# Patient Record
Sex: Female | Born: 1985
Health system: Southern US, Community
[De-identification: ages and names within clinical notes are randomized; demographics above are authoritative.]

## PROBLEM LIST (undated history)

## (undated) ENCOUNTER — Inpatient Hospital Stay (HOSPITAL_COMMUNITY): Payer: Self-pay

## (undated) DIAGNOSIS — K219 Gastro-esophageal reflux disease without esophagitis: Secondary | ICD-10-CM

## (undated) DIAGNOSIS — E039 Hypothyroidism, unspecified: Secondary | ICD-10-CM

## (undated) DIAGNOSIS — T7840XA Allergy, unspecified, initial encounter: Secondary | ICD-10-CM

## (undated) DIAGNOSIS — O24419 Gestational diabetes mellitus in pregnancy, unspecified control: Secondary | ICD-10-CM

## (undated) DIAGNOSIS — G43909 Migraine, unspecified, not intractable, without status migrainosus: Secondary | ICD-10-CM

## (undated) DIAGNOSIS — Z8619 Personal history of other infectious and parasitic diseases: Secondary | ICD-10-CM

## (undated) DIAGNOSIS — E063 Autoimmune thyroiditis: Secondary | ICD-10-CM

## (undated) DIAGNOSIS — Z8719 Personal history of other diseases of the digestive system: Secondary | ICD-10-CM

## (undated) DIAGNOSIS — N904 Leukoplakia of vulva: Secondary | ICD-10-CM

## (undated) HISTORY — PX: DENTAL SURGERY: SHX609

## (undated) HISTORY — DX: Personal history of other infectious and parasitic diseases: Z86.19

## (undated) HISTORY — DX: Personal history of other diseases of the digestive system: Z87.19

## (undated) HISTORY — DX: Leukoplakia of vulva: N90.4

## (undated) HISTORY — DX: Migraine, unspecified, not intractable, without status migrainosus: G43.909

## (undated) HISTORY — DX: Gastro-esophageal reflux disease without esophagitis: K21.9

## (undated) HISTORY — DX: Allergy, unspecified, initial encounter: T78.40XA

---

## 2009-03-06 ENCOUNTER — Encounter: Payer: Self-pay | Admitting: *Deleted

## 2009-03-23 ENCOUNTER — Encounter: Payer: Self-pay | Admitting: Family Medicine

## 2009-03-23 ENCOUNTER — Ambulatory Visit: Payer: Self-pay | Admitting: Family Medicine

## 2009-03-23 DIAGNOSIS — E063 Autoimmune thyroiditis: Secondary | ICD-10-CM | POA: Insufficient documentation

## 2009-03-23 DIAGNOSIS — E0590 Thyrotoxicosis, unspecified without thyrotoxic crisis or storm: Secondary | ICD-10-CM | POA: Insufficient documentation

## 2009-04-12 ENCOUNTER — Encounter: Payer: Self-pay | Admitting: Family Medicine

## 2009-05-30 ENCOUNTER — Encounter: Payer: Self-pay | Admitting: Family Medicine

## 2009-05-30 ENCOUNTER — Ambulatory Visit: Payer: Self-pay | Admitting: Family Medicine

## 2009-05-30 DIAGNOSIS — M779 Enthesopathy, unspecified: Secondary | ICD-10-CM | POA: Insufficient documentation

## 2009-05-30 LAB — CONVERTED CEMR LAB
Creatinine, Ser: 0.83 mg/dL (ref 0.40–1.20)
Sodium: 141 meq/L (ref 135–145)

## 2009-05-31 ENCOUNTER — Encounter: Payer: Self-pay | Admitting: Family Medicine

## 2009-09-21 ENCOUNTER — Ambulatory Visit: Payer: Self-pay | Admitting: Family Medicine

## 2009-09-21 DIAGNOSIS — R220 Localized swelling, mass and lump, head: Secondary | ICD-10-CM | POA: Insufficient documentation

## 2009-09-21 DIAGNOSIS — R221 Localized swelling, mass and lump, neck: Secondary | ICD-10-CM

## 2009-10-11 ENCOUNTER — Encounter: Payer: Self-pay | Admitting: Family Medicine

## 2009-10-25 ENCOUNTER — Encounter: Payer: Self-pay | Admitting: Family Medicine

## 2010-05-08 ENCOUNTER — Encounter: Payer: Self-pay | Admitting: Family Medicine

## 2010-09-03 NOTE — Consult Note (Signed)
Summary: [facility]  [facility]   Imported By: Knox Royalty 11/12/2009 14:19:52  _____________________________________________________________________  External Attachment:    Type:   Image     Comment:   External Document

## 2010-09-03 NOTE — Assessment & Plan Note (Signed)
Summary: sore throat,df   Vital Signs:  Patient profile:   25 year old female Height:      61.5 inches Weight:      115 pounds BMI:     21.45 BSA:     1.50 Temp:     98.9 degrees F Pulse rate:   109 / minute BP sitting:   120 / 82  Vitals Entered By: Jone Baseman CMA (September 21, 2009 2:24 PM) CC: sore tongue x 2 days Is Patient Diabetic? No Pain Assessment Patient in pain? yes     Location: under tongue Intensity: 3   CC:  sore tongue x 2 days.  History of Present Illness: 1. sore tongue Has had a sore tongue for 2 days. Initially started out as a mild sore on the underside of right side of her tongue. Slowly progressive until today when she noticed more pain and swelling involving both sides of her tongue. Has seen white spots in the area as well. No drainage or pus.   PMHx: no history of oral herpes or oral ulcers. Does have an unusual medical condition wherein she still has many of her primary teeth. 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 Last surgery was in 2009. No recent medication changes. otherwise healthy.   Habits & Providers  Alcohol-Tobacco-Diet     Tobacco Status: never  Problems Prior to Update: 1)  Tendinitis  (ICD-726.90) 2)  Hyperthyroidism, Subclinical  (ICD-242.90) 3)  Family History Diabetes 1st Degree Relative  (ICD-V18.0)  Current Medications (verified): 1)  Ortho Tri-Cyclen (28) 0.18/0.215/0.25 Mg-35 Mcg Tabs (Norgestim-Eth Estrad Triphasic) .... One By Mouth Per Day  Allergies (verified): 1)  ! Amoxicillin 2)  ! * Cefzil  Review of Systems       no fevers, chills, nausea/vomiting; no chest pain, SOB.   Physical Exam  General:  Well-developed,well-nourished,in no acute distress; alert,appropriate and cooperative throughout examination. VS reviewed and normal  Mouth:  MMM; mucosa is pink generally; dentition is appropriate, however many teeth are primary  with some yellow discoloration; hygeine is good. On inferior surface of tonger there is erythema and swelling with some white exudate worse on the R side of the frenulum. This area is TTP. No discharge, drainage, pus or apthous ulcers.  Neck:  mild R sided anterior cevical LAD. Mildly TTP Lungs:  work of breathing unlabored, clear to auscultation bilaterally; no wheezes, rales, or ronchi; good air movement throughout  Heart:  regular rate and rhythm, no murmurs; normal s1/s2    Impression & Recommendations:  Problem # 1:  SWELLING MASS OR LUMP IN HEAD AND NECK (ICD-784.2) Assessment New  no systemic signs of infection. would be atypical location and presentation for an abscess. Will hold off on antibiotics. Does not appear to be ludwigs angina. Dought HSV or herpangina given presentation. conservative measures for now with saltwater, topical anesthetics and ibuprofen. To return on not seeing some improvement by monday. Red flags reviewed with pt and she expresses understanding. Doub this is related to previous surgeries given that they are so remote.   Orders: [facility]- Est Level  3 ([address])  Complete Medication List: 1)  Ortho Tri-cyclen (28) 0.18/0.215/0.25 Mg-35 Mcg Tabs (Norgestim-eth estrad triphasic) .... One by mouth per day  Patient Instructions: 1)  gargle with saltwater 4-5 times a day. 2)  use ibuprofen 600 mg every 6-8 hours to help with pain 3)  use Anbesol or oragel  to help with pain.  4)  follow-up if not getting better by early next week. Proceed to the emergency department over the weekend for worsening swelling, problems breathing, or other concerns.  5)  drink lots of fluid

## 2010-09-03 NOTE — Consult Note (Signed)
Summary: [facility]   Imported By: Abundio Miu 05/23/2010 08:30:07  _____________________________________________________________________  External Attachment:    Type:   Image     Comment:   External Document

## 2010-09-18 ENCOUNTER — Encounter: Payer: Self-pay | Admitting: *Deleted

## 2011-06-05 ENCOUNTER — Ambulatory Visit (INDEPENDENT_AMBULATORY_CARE_PROVIDER_SITE_OTHER): Payer: BC Managed Care – PPO | Admitting: *Deleted

## 2011-06-05 VITALS — Temp 98.5°F

## 2011-06-05 DIAGNOSIS — Z23 Encounter for immunization: Secondary | ICD-10-CM

## 2011-07-25 ENCOUNTER — Encounter: Payer: Self-pay | Admitting: Family Medicine

## 2011-07-25 ENCOUNTER — Ambulatory Visit (INDEPENDENT_AMBULATORY_CARE_PROVIDER_SITE_OTHER): Payer: BC Managed Care – PPO | Admitting: Family Medicine

## 2011-07-25 DIAGNOSIS — J069 Acute upper respiratory infection, unspecified: Secondary | ICD-10-CM

## 2011-07-25 NOTE — Assessment & Plan Note (Signed)
Likely post viral cough and nasal congestion. Discussed with pt that sxs may take 2-4 weeks to completely resolve. Also discussed with pt that prolonged pseudophed use may exacerbate nasal congestion. Discussed supportive care. Handout given.

## 2011-07-25 NOTE — Progress Notes (Signed)
  Subjective:    Patient ID: Sherry Shepherd, female    DOB: 1986/02/11, 25 y.o.   MRN: [id number]  HPI Pt presents today with URI sxs x 2 weeks. Pt states that her sxs initially began as viral URI sxs including nasla congestion, rhinorrhea, headache, generalized malaise, cough, intermittent fevers. Pt states that fevers and malaise stopped over the course of a week. Sxs that remained were nasal congestion, rhinorrhea, and cough. Nasal congestion and cough seem to be worse in am. No fevers or increased WOB. Pt is a non smoker. Pt states that she has been using mucinex and pseudophed since onset of sxs with minimal improvement in sxs. No hx/o allergies or sinus disease.    Review of Systems See HPI, otherwise ROS negative     Objective:   Physical Exam Gen: up in chair, NAD HEENT: NCAT, EOMI, TMs clear bilaterally, +nasal erythema, rhinorrhea bilaterally, + post oropharyngeal erythema  CV: RRR, no murmurs auscultated PULM: CTAB, no wheezes, rales, rhoncii ABD: S/NT/+ bowel sounds  EXT: 2+ peripheral pulses   Assessment & Plan:

## 2011-07-25 NOTE — Patient Instructions (Signed)
Viral Infections A viral infection can be caused by different types of viruses.Most viral infections are not serious and resolve on their own. However, some infections may cause severe symptoms and may lead to further complications. SYMPTOMS Viruses can frequently cause:  Minor sore throat.   Aches and pains.   Headaches.   Runny nose.   Different types of rashes.   Watery eyes.   Tiredness.   Cough.   Loss of appetite.   Gastrointestinal infections, resulting in nausea, vomiting, and diarrhea.  These symptoms do not respond to antibiotics because the infection is not caused by bacteria. However, you might catch a bacterial infection following the viral infection. This is sometimes called a "superinfection." Symptoms of such a bacterial infection may include:  Worsening sore throat with pus and difficulty swallowing.   Swollen neck glands.   Chills and a high or persistent fever.   Severe headache.   Tenderness over the sinuses.   Persistent overall ill feeling (malaise), muscle aches, and tiredness (fatigue).   Persistent cough.   Yellow, green, or brown mucus production with coughing.  HOME CARE INSTRUCTIONS   Only take over-the-counter or prescription medicines for pain, discomfort, diarrhea, or fever as directed by your caregiver.   Drink enough water and fluids to keep your urine clear or pale yellow. Sports drinks can provide valuable electrolytes, sugars, and hydration.   Get plenty of rest and maintain proper nutrition. Soups and broths with crackers or rice are fine.  SEEK IMMEDIATE MEDICAL CARE IF:   You have severe headaches, shortness of breath, chest pain, neck pain, or an unusual rash.   You have uncontrolled vomiting, diarrhea, or you are unable to keep down fluids.   You have a temperature above 102 F (38.9 C), not controlled by medicine.  MAKE SURE YOU:   Understand these instructions.   Will watch your condition.   Will get help right  away if you are not doing well or get worse.  Document Released: 04/30/2005 Document Revised: 04/02/2011 Document Reviewed: 11/25/2010 [facility] Patient Information 2012 [place], [place].

## 2011-08-08 ENCOUNTER — Encounter: Payer: Self-pay | Admitting: Family Medicine

## 2011-08-08 ENCOUNTER — Ambulatory Visit (INDEPENDENT_AMBULATORY_CARE_PROVIDER_SITE_OTHER): Payer: BC Managed Care – PPO | Admitting: Family Medicine

## 2011-08-08 VITALS — BP 127/85 | HR 121 | Temp 98.1°F | Ht 61.0 in | Wt 129.0 lb

## 2011-08-08 DIAGNOSIS — J069 Acute upper respiratory infection, unspecified: Secondary | ICD-10-CM

## 2011-08-08 MED ORDER — BENZONATATE 100 MG PO CAPS: 100.0000 mg | ORAL_CAPSULE | Freq: Two times a day (BID) | ORAL | Status: AC | PRN

## 2011-08-08 NOTE — Patient Instructions (Signed)
It was nice to meet you.  I do not see any signs of bacterial infection, so I do not think antibiotics will help you.  Your cough seems often enough that I want you to try a cough medication called Tessalon pearls so that your cough is suppressed.  You should also keep taking the mucinex.   If you start having fevers, chills, feels short of breath, please call the office to be seen.

## 2011-08-08 NOTE — Assessment & Plan Note (Signed)
No sign of bacterial infection PNA, Bronchitis, sinusitis, otitis on exam today.  Also patient reports no fevers and feeling well except cough.  I feel this is still a Viral URI, but the cough is very disruptive to her work.  Will Rx Tessalon pearls for anti-tussive medication she can take during work.  Advised continue mucinex.    Reviewed signs (fever, shortness of breath) of bacterial infection, patient instructed to return if those symptoms develop.

## 2011-08-08 NOTE — Progress Notes (Signed)
  Subjective:    Patient ID: Sherry Shepherd, female    DOB: 1986-07-22, 26 y.o.   MRN: [id number]  HPI  Sherry Shepherd comes back in due to continued cough after a viral illness.  She says she had a virus about 4 weeks and initially had a low grade fever, felt fatigued, but those symptoms cleared, and a cough and congestion has persisted.  She says she coughs constantly during the day, which makes people uncomfortable at work.  She also talks on the phone a lot at work, which is interrupted by the coughing.  She is taking the mucinex and zyrtec as advised by Dr. Alvester Morin, and has not taken Sudafed since seeing him.  She has also been drinking lots of water and hot tea.     Review of Systems  Constitutional: Negative for fever, chills and fatigue.  HENT: Negative for sneezing and neck pain.   Eyes: Negative for itching.  Respiratory: Positive for cough. Negative for shortness of breath.   Cardiovascular: Negative for chest pain.  Gastrointestinal: Negative for nausea, vomiting and diarrhea.  Neurological: Negative for dizziness and weakness.       Objective:   Physical Exam BP 127/85  Pulse 121  Temp(Src) 98.1 F (36.7 C) (Oral)  Ht 5\' 1"  (1.549 m)  Wt 129 lb (58.514 kg)  BMI 24.37 kg/m2  LMP 07/10/2011 General appearance: alert, cooperative and coughing frequently but otherwise well appearing.  Eyes: PERRL, EOMIT, conjunctiva clear Ears: normal TM's and external ear canals both ears Nose: Nares normal. Septum midline. Mucosa normal. No drainage or sinus tenderness. Throat: lips, mucosa, and tongue normal; teeth and gums normal and no tonsillar erythema or exudates Neck: no adenopathy Lungs: clear to auscultation bilaterally Heart: regular rate and rhythm, S1, S2 normal, no murmur, click, rub or gallop       Assessment & Plan:

## 2011-10-07 ENCOUNTER — Encounter: Payer: Self-pay | Admitting: Family Medicine

## 2011-10-07 ENCOUNTER — Ambulatory Visit (INDEPENDENT_AMBULATORY_CARE_PROVIDER_SITE_OTHER): Payer: BC Managed Care – PPO | Admitting: Family Medicine

## 2011-10-07 VITALS — BP 114/77 | HR 89 | Temp 97.9°F | Ht 61.0 in | Wt 133.0 lb

## 2011-10-07 DIAGNOSIS — Z7185 Encounter for immunization safety counseling: Secondary | ICD-10-CM | POA: Insufficient documentation

## 2011-10-07 DIAGNOSIS — E663 Overweight: Secondary | ICD-10-CM

## 2011-10-07 DIAGNOSIS — R5383 Other fatigue: Secondary | ICD-10-CM | POA: Insufficient documentation

## 2011-10-07 DIAGNOSIS — Z7189 Other specified counseling: Secondary | ICD-10-CM

## 2011-10-07 DIAGNOSIS — Z23 Encounter for immunization: Secondary | ICD-10-CM

## 2011-10-07 DIAGNOSIS — R5381 Other malaise: Secondary | ICD-10-CM

## 2011-10-07 DIAGNOSIS — Z831 Family history of other infectious and parasitic diseases: Secondary | ICD-10-CM

## 2011-10-07 DIAGNOSIS — E0590 Thyrotoxicosis, unspecified without thyrotoxic crisis or storm: Secondary | ICD-10-CM

## 2011-10-07 DIAGNOSIS — Z840 Family history of diseases of the skin and subcutaneous tissue: Secondary | ICD-10-CM

## 2011-10-07 HISTORY — DX: Overweight: E66.3

## 2011-10-07 LAB — COMPREHENSIVE METABOLIC PANEL
Albumin: 4.3 g/dL (ref 3.5–5.2)
Alkaline Phosphatase: 66 U/L (ref 39–117)
BUN: 9 mg/dL (ref 6–23)
CO2: 21 mEq/L (ref 19–32)
Calcium: 9.1 mg/dL (ref 8.4–10.5)
Glucose, Bld: 84 mg/dL (ref 70–99)
Potassium: 4 mEq/L (ref 3.5–5.3)

## 2011-10-07 LAB — CBC WITH DIFFERENTIAL/PLATELET
Hemoglobin: 12.8 g/dL (ref 12.0–15.0)
Lymphocytes Relative: 46 % (ref 12–46)
Lymphs Abs: 3.8 10*3/uL (ref 0.7–4.0)
MCH: 28.2 pg (ref 26.0–34.0)
Monocytes Relative: 7 % (ref 3–12)
Neutro Abs: 3.9 10*3/uL (ref 1.7–7.7)
Neutrophils Relative %: 46 % (ref 43–77)
Platelets: 317 10*3/uL (ref 150–400)
RBC: 4.54 MIL/uL (ref 3.87–5.11)
WBC: 8.4 10*3/uL (ref 4.0–10.5)

## 2011-10-07 LAB — LIPID PANEL
Cholesterol: 177 mg/dL (ref 0–200)
Total CHOL/HDL Ratio: 3 Ratio

## 2011-10-07 LAB — T4, FREE: Free T4: 1.2 ng/dL (ref 0.80–1.80)

## 2011-10-07 LAB — TSH: TSH: 2.583 u[IU]/mL (ref 0.350–4.500)

## 2011-10-07 MED ORDER — ALBENDAZOLE 200 MG PO TABS: 400.0000 mg | ORAL_TABLET | Freq: Once | ORAL | Status: AC

## 2011-10-07 NOTE — Progress Notes (Signed)
  Subjective:    Patient ID: Sherry Shepherd, female    DOB: [birth year], 26 y.o.   MRN: [id number]  HPI 26 year old female coming in with the potential for pinworms. 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 Patient though was told to be seen.  Patient also has had a history of subclinical hyperthyroidism. Recently though she has had some weight gain as well as increasing fatigue. Patient states that she has not been eating quite as she should and has even attempted to be eating less red meat recently. Patient continues take her birth control and takes a multivitamin as needed but not on regular basis. Patient denies any recent illnesses or any recent travel history. Patient states that she is sleeping well.  The patient in the past had been seen by a endocrinologist in [place] but has not followed up with him for 2 years. Patient is on ultrasounds which did show thyromegaly but no masses.  Patient also like to get a gardisil shot. Filed Weights   10/07/11 0931  Weight: 133 lb (60.328 kg)      Review of Systems Positive for fatigue but denies fever, chills, abdominal pain, diarrhea or constipation. Denies any rash.    Objective:   Physical Exam  Constitutional: She appears well-developed and well-nourished.  HENT:  Head: Normocephalic.  Right Ear: External ear normal.  Left Ear: External ear normal.  Eyes: Pupils are equal, round, and reactive to light.  Neck: Normal range of motion. No tracheal deviation present. Thyromegaly present.       Patient has mild thyromegaly with a left-sided cervical lymphadenopathy.  Cardiovascular: Normal rate, regular rhythm and normal heart sounds.   No murmur heard. Pulmonary/Chest: Effort normal and breath sounds normal.  Lymphadenopathy:    She has cervical adenopathy.  Neurological: She is alert.  Skin: Skin is warm  and dry.  Psychiatric: She has a normal mood and affect.      Assessment & Plan:

## 2011-10-07 NOTE — Assessment & Plan Note (Signed)
Very interesting history per patient. Patient does have a mild thyromegaly and very pronounced thyroid but did not feel that it is significantly enlarged. At this time we'll get a TSH and a free T4 and monitor. Do not think an ultrasound would help in this aspect but if anything changes or if labs are abnormal then we'll check.

## 2011-10-07 NOTE — Patient Instructions (Signed)
Very nice to meet you. I will give you a medicine called but then does all your take one pill once and then repeat in 2 weeks. I will check labs today to see if we can find a reason for your fatigue. I will call you with the results. He should probably followup with her primary care physician for a physical exam in the near future.

## 2011-10-07 NOTE — Progress Notes (Signed)
Addended by: Garen Grams F on: 10/07/2011 10:15 AM   Modules accepted: Orders

## 2011-10-07 NOTE — Assessment & Plan Note (Signed)
Patient's fatigue could be concerning for her thyroid also will check B12 levels and vitamin D Also check for any type of anemia. Discussed proper sleep habits with patient having irregular working schedule. Discussed the potential for melatonin if everything is normal.

## 2011-10-07 NOTE — Assessment & Plan Note (Signed)
Body mass index is 25.13 kg/(m^2). Which is just on the borderline of being overweight. We will check thyroid encouraged exercise and  nutrition.

## 2011-10-08 LAB — VITAMIN D 25 HYDROXY (VIT D DEFICIENCY, FRACTURES): Vit D, 25-Hydroxy: 57 ng/mL (ref 30–89)

## 2011-11-07 ENCOUNTER — Ambulatory Visit: Payer: BC Managed Care – PPO

## 2012-03-22 ENCOUNTER — Ambulatory Visit (INDEPENDENT_AMBULATORY_CARE_PROVIDER_SITE_OTHER): Payer: 59 | Admitting: Family Medicine

## 2012-03-22 ENCOUNTER — Encounter: Payer: Self-pay | Admitting: Family Medicine

## 2012-03-22 VITALS — BP 122/84 | HR 72 | Temp 98.2°F | Ht 61.0 in | Wt 130.9 lb

## 2012-03-22 DIAGNOSIS — L0100 Impetigo, unspecified: Secondary | ICD-10-CM

## 2012-03-22 MED ORDER — MUPIROCIN 2 % EX OINT: TOPICAL_OINTMENT | Freq: Three times a day (TID) | CUTANEOUS | Status: AC

## 2012-03-22 NOTE — Patient Instructions (Addendum)
Nice to meet you. You seem to have a limited infection to your finger now. Will treat with topical antibiotic. Make sure to continue hand washing, you can where a glove at work if desired. If not improved in a few days or worsened, then come back you may need a pill antibiotic.  Impetigo Impetigo is an infection of the skin, most common in babies and children.  CAUSES  It is caused by staphylococcal or streptococcal germs (bacteria). Impetigo can start after any damage to the skin. The damage to the skin may be from things like:   Chickenpox.   Scrapes.   Scratches.   Insect bites (common when children scratch the bite).   Cuts.   Nail biting or chewing.  Impetigo is contagious. It can be spread from one person to another. Avoid close skin contact, or sharing towels or clothing. SYMPTOMS  Impetigo usually starts out as small blisters or pustules. Then they turn into tiny yellow-crusted sores (lesions).  There may also be:  Large blisters.   Itching or pain.   Pus.   Swollen lymph glands.  With scratching, irritation, or non-treatment, these small areas may get larger. Scratching can cause the germs to get under the fingernails; then scratching another part of the skin can cause the infection to be spread there. DIAGNOSIS  Diagnosis of impetigo is usually made by a physical exam. A skin culture (test to grow bacteria) may be done to prove the diagnosis or to help decide the best treatment.  TREATMENT  Mild impetigo can be treated with prescription antibiotic cream. Oral antibiotic medicine may be used in more severe cases. Medicines for itching may be used. HOME CARE INSTRUCTIONS   To avoid spreading impetigo to other body areas:   Keep fingernails short and clean.   Avoid scratching.   Cover infected areas if necessary to keep from scratching.   Gently wash the infected areas with antibiotic soap and water.   Soak crusted areas in warm soapy water using antibiotic  soap.   Gently rub the areas to remove crusts. Do not scrub.   Wash hands often to avoid spread this infection.   Keep children with impetigo home from school or daycare until they have used an antibiotic cream for 48 hours (2 days) or oral antibiotic medicine for 24 hours (1 day), and their skin shows significant improvement.   Children may attend school or daycare if they only have a few sores and if the sores can be covered by a bandage or clothing.  SEEK MEDICAL CARE IF:   More blisters or sores show up despite treatment.   Other family members get sores.   Rash is not improving after 48 hours (2 days) of treatment.  SEEK IMMEDIATE MEDICAL CARE IF:   You see spreading redness or swelling of the skin around the sores.   You see red streaks coming from the sores.   Your child develops a fever of 100.4 F (37.2 C) or higher.   Your child develops a sore throat.   Your child is acting ill (lethargic, sick to their stomach).  Document Released: 07/18/2000 Document Revised: 07/10/2011 Document Reviewed: 05/17/2008 [facility] Patient Information 2012 [place], [place].

## 2012-03-22 NOTE — Progress Notes (Signed)
  Subjective:    Patient ID: Sherry Shepherd, female    DOB: 24-Oct-1985, 26 y.o.   MRN: [id number]  HPI  1. Left middle finger wound. Patient states last week she noticed a small vesicle that was very itchy on side of her left middle finger. She may have pinched herself here last week. She broke open the vesicle. Fluid has drained. Since that time it has continued to be irritated and draining yellowish and crusty fluid. She has used bandages over the period no new vesicles or spread. No fevers, malaise, joint pain, lesions elsewhere.   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 Denies any history of boils, MRSA infection.  Review of Systems See HPI otherwise negative.  reports that she has never smoked. She does not have any smokeless tobacco history on file.    Objective:   Physical Exam  Vitals reviewed. Constitutional: She is oriented to person, place, and time. She appears well-developed and well-nourished. No distress.  HENT:  Head: Normocephalic and atraumatic.  Eyes: EOM are normal.  Pulmonary/Chest: Effort normal.  Neurological: She is alert and oriented to person, place, and time.  Skin: No rash noted.       2-3 mm erythematous circumscribed area on medial aspect lateral left 3rd digit. No surrounding erythema, ROM intact, no edema. No underlying abscess.  Psychiatric: She has a normal mood and affect.       Assessment & Plan:

## 2012-03-22 NOTE — Assessment & Plan Note (Signed)
Perhaps very mild and isolated area of superficial skin irritation/impetigo. Will treat with topical mupirocin for 5-7 days. Discussed bandage changes BID. 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 Frequent handwashing and hygiene measures. Discussed followup if not improved or if lesion worsens.

## 2012-09-10 ENCOUNTER — Encounter: Payer: Self-pay | Admitting: Family Medicine

## 2012-09-10 ENCOUNTER — Ambulatory Visit (INDEPENDENT_AMBULATORY_CARE_PROVIDER_SITE_OTHER): Payer: 59 | Admitting: Family Medicine

## 2012-09-10 VITALS — BP 126/86 | HR 78 | Wt 135.0 lb

## 2012-09-10 DIAGNOSIS — K1370 Unspecified lesions of oral mucosa: Secondary | ICD-10-CM

## 2012-09-10 DIAGNOSIS — K1379 Other lesions of oral mucosa: Secondary | ICD-10-CM | POA: Insufficient documentation

## 2012-09-10 MED ORDER — NYSTATIN 100000 UNIT/ML MT SUSP: 500000.0000 [IU] | Freq: Four times a day (QID) | OROMUCOSAL | Status: DC

## 2012-09-10 NOTE — Progress Notes (Signed)
  Subjective:    Patient ID: Sherry Shepherd, female    DOB: 05/20/1986, 27 y.o.   MRN: [id number]  HPI  1.  Tongue pain:  Patient has "bumps under tongue" and tongue pain/swelling at the tip and lower lip for past 3 days or so.  No changes in what she eats, no trouble with airway, no visible rash outside lips.  No recent antibiotics usage.  Had similar symptoms about 2 years ago and sent to ENT, diagnosed with thrush and started on Nystatin.  Resolved immediately.  No history of any reason for her to be immunocompromised.   No weight gain/lose.  No heat/cold intolerance.   Review of Systems See HPI above for review of systems.       Objective:   Physical Exam BP 126/86  Pulse 78  Wt 135 lb (61.236 kg) Gen:  Patient sitting on exam table, appears stated age in no acute distress.  Pleasant, conversant Head: Normocephalic atraumatic Eyes: EOMI, PERRL, sclera and conjunctiva non-erythematous Ears:  Canals clear bilaterally.  TMs pearly gray bilaterally without erythema or bulging.   Nose:  Nares patent Mouth: Mucosa membranes moist. Tonsils +2, nonenlarged, non-erythematous.  Minimal white papules scattered end of tongue, 3-4 in total. No actual tongue swelling.  No swelling of lip. No aphthous ulcers, nodules, pustules noted throughout rest of buccal mucosa including lips.  No leukoplakia.   Neck: No cervical lymphadenopathy noted          Assessment & Plan:

## 2012-09-10 NOTE — Assessment & Plan Note (Addendum)
Unclear etiology, but consistent symptoms of prior thrush infection.   Plan to repeat oral Nystatin for relief.  Oral HSV infection another possibility. FU with [place] in 1-2 weeks to ensure resolution, sooner if worsening  No evidence/concern for angioedema.

## 2012-09-10 NOTE — Patient Instructions (Addendum)
Use the Nystatin 4 times a day.    This should knock it out in about a week.   If this isn't helping, let us know.    It was good to meet you today!

## 2013-04-03 ENCOUNTER — Ambulatory Visit: Payer: BC Managed Care – PPO

## 2013-04-03 ENCOUNTER — Ambulatory Visit (INDEPENDENT_AMBULATORY_CARE_PROVIDER_SITE_OTHER): Payer: BC Managed Care – PPO | Admitting: Family Medicine

## 2013-04-03 VITALS — BP 134/92 | HR 125 | Temp 98.7°F | Resp 18 | Ht 61.0 in | Wt 133.0 lb

## 2013-04-03 DIAGNOSIS — R1013 Epigastric pain: Secondary | ICD-10-CM

## 2013-04-03 DIAGNOSIS — R059 Cough, unspecified: Secondary | ICD-10-CM

## 2013-04-03 DIAGNOSIS — R05 Cough: Secondary | ICD-10-CM

## 2013-04-03 LAB — POCT CBC
Granulocyte percent: 50.3 %G (ref 37–80)
HCT, POC: 41.5 % (ref 37.7–47.9)
Hemoglobin: 13.2 g/dL (ref 12.2–16.2)
Lymph, poc: 3.3 (ref 0.6–3.4)
MCH, POC: 28.2 pg (ref 27–31.2)
MCHC: 31.8 g/dL (ref 31.8–35.4)
MCV: 88.7 fL (ref 80–97)
MID (cbc): 0.54 (ref 0–0.9)
MPV: 10.6 fL (ref 0–99.8)
POC Granulocyte: 3.9 (ref 2–6.9)
POC LYMPH PERCENT: 43.3 %L (ref 10–50)
POC MID %: 6.4 %M (ref 0–12)
Platelet Count, POC: 306 10*3/uL (ref 142–424)
RBC: 4.68 M/uL (ref 4.04–5.48)
RDW, POC: 13.6 %
WBC: 7.7 10*3/uL (ref 4.6–10.2)

## 2013-04-03 MED ORDER — OMEPRAZOLE 40 MG PO CPDR: 40.0000 mg | DELAYED_RELEASE_CAPSULE | Freq: Every day | ORAL | Status: DC

## 2013-04-03 MED ORDER — AZITHROMYCIN 250 MG PO TABS: ORAL_TABLET | ORAL | Status: DC

## 2013-04-03 MED ORDER — HYDROCOD POLST-CHLORPHEN POLST 10-8 MG/5ML PO LQCR: 5.0000 mL | Freq: Two times a day (BID) | ORAL | Status: DC | PRN

## 2013-04-03 NOTE — Progress Notes (Signed)
  Subjective:    Patient ID: Sherry Shepherd, female    DOB: 1985/10/28, 26 y.o.   MRN: [id number]  HPI 27 year old female presents with 6 day history of cough, nasal congestion, PND, fever, chills, and body aches. States symptoms started suddenly and have progressively worsened. She had fever and chills at onset but says this has been intermittent.  Coughing is persistent, dry, and hacking.  Admits she will occasionally have small amount of mucous production.  Overall the most troublesome symptom is her cough.  Has had some SOB and fatigue but no wheezing or hemoptysis.  Works at [employer] - no known sick contacts.   Patient is otherwise healthy with no other concerns today.     Review of Systems  Constitutional: Positive for fever (subjective) and chills.  HENT: Positive for congestion, rhinorrhea and postnasal drip. Negative for ear pain, sore throat and sinus pressure.   Respiratory: Positive for cough and shortness of breath. Negative for wheezing.   Gastrointestinal: Negative for nausea, vomiting and abdominal pain.  Neurological: Negative for dizziness and headaches.       Objective:   Physical Exam  Constitutional: She appears well-developed and well-nourished.  HENT:  Head: Normocephalic and atraumatic.  Right Ear: External ear normal.  Left Ear: External ear normal.  Mouth/Throat: Oropharynx is clear and moist.  Eyes: Conjunctivae are normal.  Neck: Normal range of motion. Neck supple.  Cardiovascular: Normal rate, regular rhythm and normal heart sounds.   Pulmonary/Chest: Effort normal and breath sounds normal. No respiratory distress (patient coughing uncontrolably during exam).  Lymphadenopathy:    She has no cervical adenopathy.  Psychiatric: She has a normal mood and affect. Her behavior is normal. Judgment and thought content normal.          Results for orders placed in visit on 04/03/13  POCT CBC      Result Value Range   WBC 7.7  4.6 - 10.2 K/uL   Lymph, poc  3.3  0.6 - 3.4   POC LYMPH PERCENT 43.3  10 - 50 %L   MID (cbc) 0.54  0 - 0.9   POC MID % 6.4  0 - 12 %M   POC Granulocyte 3.9  2 - 6.9   Granulocyte percent 50.3  37 - 80 %G   RBC 4.68  4.04 - 5.48 M/uL   Hemoglobin 13.2  12.2 - 16.2 g/dL   HCT, POC 56.2  13.0 - 47.9 %   MCV 88.7  80 - 97 fL   MCH, POC 28.2  27 - 31.2 pg   MCHC 31.8  31.8 - 35.4 g/dL   RDW, POC 86.5     Platelet Count, POC 306  142 - 424 K/uL   MPV 10.6  0 - 99.8 fL    [facility] reading (PRIMARY) by  Dr. Milus Glazier as left peribronchial upper lobe infiltrate.   Assessment & Plan:  Abdominal pain, epigastric - Plan: H. pylori antibody, IgG, DG Chest 2 View, omeprazole (PRILOSEC) 40 MG capsule  Cough - Plan: POCT CBC, azithromycin (ZITHROMAX) 250 MG tablet, chlorpheniramine-HYDROcodone (TUSSIONEX PENNKINETIC ER) 10-8 MG/5ML LQCR  Plan to treat with Zpack Tussionex q12hours prn cough - caution sedation Increase fluids and rest Continue Mucinex as directed Follow up in 48 hours if not markedly improved, sooner if worse

## 2013-04-05 ENCOUNTER — Ambulatory Visit (INDEPENDENT_AMBULATORY_CARE_PROVIDER_SITE_OTHER): Payer: BC Managed Care – PPO | Admitting: Emergency Medicine

## 2013-04-05 VITALS — BP 124/80 | HR 110 | Temp 98.5°F | Resp 16 | Ht 61.0 in | Wt 134.0 lb

## 2013-04-05 DIAGNOSIS — R05 Cough: Secondary | ICD-10-CM

## 2013-04-05 DIAGNOSIS — J209 Acute bronchitis, unspecified: Secondary | ICD-10-CM

## 2013-04-05 DIAGNOSIS — R059 Cough, unspecified: Secondary | ICD-10-CM

## 2013-04-05 LAB — H. PYLORI ANTIBODY, IGG: H Pylori IgG: 0.48 {ISR}

## 2013-04-05 MED ORDER — LEVOFLOXACIN 500 MG PO TABS: 500.0000 mg | ORAL_TABLET | Freq: Every day | ORAL | Status: DC

## 2013-04-05 MED ORDER — HYDROCODONE-HOMATROPINE 5-1.5 MG/5ML PO SYRP: 5.0000 mL | ORAL_SOLUTION | ORAL | Status: DC | PRN

## 2013-04-05 MED ORDER — ALBUTEROL SULFATE (2.5 MG/3ML) 0.083% IN NEBU
2.5000 mg | INHALATION_SOLUTION | Freq: Once | RESPIRATORY_TRACT | Status: AC
  Administered 2013-04-05: 2.5 mg via RESPIRATORY_TRACT

## 2013-04-05 MED ORDER — ALBUTEROL SULFATE HFA 108 (90 BASE) MCG/ACT IN AERS: 2.0000 | INHALATION_SPRAY | RESPIRATORY_TRACT | Status: DC | PRN

## 2013-04-05 NOTE — Patient Instructions (Signed)
Metered Dose Inhaler (No Spacer Used) Inhaled medicines are the basis of asthma treatment and other breathing problems. Inhaled medicine can only be effective if used properly. Good technique assures that the medicine reaches the lungs. Metered dose inhalers (MDIs) are used to deliver a variety of inhaled medicines. These include quick relief medicines, controller medicines (such as corticosteroids), and cromolyn. The medicine is delivered by pushing down on a metal canister to release a set amount of spray.  If you are using different kinds of inhalers, use your quick relief medicine to open the airways 10 to 15 minutes before using a steroid. If you are unsure which inhalers to use and the order of using them, ask your caregiver, nurse, or respiratory therapist. HOW TO USE THE INHALER 1. Remove cap from inhaler. 2. Shake inhaler for 5 seconds before each inhalation (breathing in). 3. Position the inhaler so that the top of the canister faces up. 4. Put your index finger on the top of the medication canister. Your thumb supports the bottom of the inhaler. 5. Open your mouth. 6. Hold the inhaler 1 to 2 inches away from your open mouth. This allows the medicine to slow down before the medicine enters the mouth. 7. Exhale (breathe out) normally and as completely as possible. 8. Press the canister down with the index finger to release the medication. 9. At the same time as the canister is pressed, inhale deeply and slowly until the lungs are completely filled. This should take 4 to 6 seconds. Keep your tongue down. 10. Hold the medication in your lungs for up to 10 seconds (10 seconds is best). This helps the medicine get into the small airways of your lungs to work better. 11. Breathe out slowly, through pursed lips. Whistling is an example of pursed lips. 12. Wait at least 1 minute between puffs. Continue with the above steps until you have taken the number of puffs your caregiver has  ordered. 13. Replace cap on inhaler. AVOID:  Inhaling before or after starting the spray of medicine. It takes practice to coordinate your breathing with triggering the spray.  Inhaling through the nose (rather than the mouth) when triggering the spray. HOW TO DETERMINE IF YOUR INHALER IS FULL OR NEARLY EMPTY:  Determine when an inhaler is empty. You cannot know when an MDI canister is empty by shaking it. A few MDIs are now being made with dose counters. Ask your caregiver for a prescription that has a dose counter if you feel you need that extra help.  If your inhaler does not have a counter, check the number of doses in the inhaler before you use it. The canister or box will list the number of doses in the canister. Divide the total number of doses in the canister by the number you will use each day to find how many days the canister will last. (For example, if your canister has 200 doses and you take 2 puffs, 4 times each day, which is 8 puffs a day. Dividing 200 by 8 equals 25. The canister should last 25 days.) Using a calendar, count forward that many days to see when your inhaler will run out. Write the refill date on a calendar or your canister.  Remember, if you need to take extra doses, the inhaler will empty sooner than you figured. Be sure you have a refill before your canister runs out. Refill your inhaler 7 to 10 days before it runs out. HOME CARE INSTRUCTIONS   Do   not use the inhaler more than your caregiver tells you. If you are still wheezing and are feeling tightness in your chest, call your caregiver.  Keep an adequate supply of medication. This includes making sure the medicine is not expired, and you have a spare MDI.  Follow your caregiver or inhaler insert directions for cleaning the inhaler. SEEK MEDICAL CARE IF:   Symptoms are only partially relieved with your inhalers.  You are having trouble using your inhalers.  You experience some increase in phlegm.  You  develop a fever of 102 F (38.9 C). SEEK IMMEDIATE MEDICAL CARE IF:   You feel little or no relief with your inhalers. You are still wheezing and are feeling shortness of breath and/or tightness in your chest.  You have side effects such as dizziness, headaches, or fast heart rate.  You have chills, fever, night sweats or an oral temperature above 102 F (38.9 C) develops.  Phlegm production increases a lot, or there is blood in the phlegm. MAKE SURE YOU:   Understand these instructions.  Will watch your condition.  Will get help right away if you are not doing well or get worse. Document Released: 05/18/2007 Document Revised: 10/13/2011 Document Reviewed: 05/08/2009 ExitCare Patient Information 2014 [employer].  

## 2013-04-05 NOTE — Progress Notes (Signed)
[facility] [address] [place] [address] [phone]  Date:  04/05/2013   Name:  Sherry Shepherd   DOB:  11/29/1985   MRN:  [id number]  PCP:  Tana Conch, MD    Chief Complaint: Follow-up   History of Present Illness:  Sherry Shepherd is a 27 y.o. very pleasant female patient who presents with the following:  Seen in office by Dr L on Sunday and treated with zpak and tussionex for early pneumonia.  Has not improved clinically on treatment.  No fever or chills, nausea or vomiting.  No rash.  Some exertional shortness of breath.  No wheezing.  No improvement with over the counter medications or other home remedies. Denies other complaint or health concern today.   Patient Active Problem List   Diagnosis Date Noted  . Oral pain 09/10/2012  . Impetigo 03/22/2012  . Overweight 10/07/2011  . Fatigue 10/07/2011  . HPV vaccine counseling 10/07/2011  . Family history of pinworm infection 10/07/2011  . SWELLING MASS OR LUMP IN HEAD AND NECK 09/21/2009  . TENDINITIS 05/30/2009  . HYPERTHYROIDISM, SUBCLINICAL 03/23/2009    Past Medical History  Diagnosis Date  . Allergy     No past surgical history on file.  History  Substance Use Topics  . Smoking status: Never Smoker   . Smokeless tobacco: Never Used  . Alcohol Use: No    Family History  Problem Relation Age of Onset  . Diabetes Mother   . Diabetes Father     Allergies  Allergen Reactions  . Amoxicillin     REACTION: hives  . Cefprozil     Medication list has been reviewed and updated.  Current Outpatient Prescriptions on File Prior to Visit  Medication Sig Dispense Refill  . azithromycin (ZITHROMAX) 250 MG tablet Take 2 tabs PO x 1 dose, then 1 tab PO QD x 4 days  6 tablet  0  . chlorpheniramine-HYDROcodone (TUSSIONEX PENNKINETIC ER) 10-8 MG/5ML LQCR Take 5 mLs by mouth every 12 (twelve) hours as needed (cough).  60 mL  0  . Norgestim-Eth Estrad Triphasic (ORTHO TRI-CYCLEN, 28,)  0.18/0.215/0.25 MG-35 MCG TABS Take 1 tablet by mouth daily.        Marland Kitchen omeprazole (PRILOSEC) 40 MG capsule Take 1 capsule (40 mg total) by mouth daily.  30 capsule  1  . nystatin (MYCOSTATIN) 100000 UNIT/ML suspension Take 5 mLs (500,000 Units total) by mouth 4 (four) times daily.  60 mL  0   No current facility-administered medications on file prior to visit.    Review of Systems:  As per HPI, otherwise negative.    Physical Examination: Filed Vitals:   04/05/13 1317  BP: 124/80  Pulse: 110  Temp: 98.5 F (36.9 C)  Resp: 16   Filed Vitals:   04/05/13 1317  Height: 5\' 1"  (1.549 m)  Weight: 134 lb (60.782 kg)   Body mass index is 25.33 kg/(m^2). Ideal Body Weight: Weight in (lb) to have BMI = 25: 132   GEN: WDWN, NAD, Non-toxic, A & O x 3 HEENT: Atraumatic, Normocephalic. Neck supple. No masses, No LAD. Ears and Nose: No external deformity. CV: RRR, No M/G/R. No JVD. No thrill. No extra heart sounds. PULM: CTA B, no wheezes, crackles, rhonchi. No retractions. No resp. distress. No accessory muscle use. ABD: S, NT, ND, +BS. No rebound. No HSM. EXTR: No c/c/e NEURO Normal gait.  PSYCH: Normally interactive. Conversant. Not depressed or anxious appearing.  Calm  demeanor.    Assessment and Plan: Cough Reviewed old record and CXR image from Sunday Neb treatment Albuterol MDI levaquin Change cough syrup  Signed,  Phillips Odor, MD

## 2013-10-09 ENCOUNTER — Ambulatory Visit (INDEPENDENT_AMBULATORY_CARE_PROVIDER_SITE_OTHER): Payer: BC Managed Care – PPO | Admitting: Family Medicine

## 2013-10-09 VITALS — BP 108/70 | HR 85 | Temp 98.5°F | Resp 16 | Ht 61.25 in | Wt 140.2 lb

## 2013-10-09 DIAGNOSIS — H109 Unspecified conjunctivitis: Secondary | ICD-10-CM

## 2013-10-09 DIAGNOSIS — K219 Gastro-esophageal reflux disease without esophagitis: Secondary | ICD-10-CM

## 2013-10-09 MED ORDER — TOBRAMYCIN-DEXAMETHASONE 0.3-0.1 % OP SUSP: 2.0000 [drp] | Freq: Four times a day (QID) | OPHTHALMIC | Status: DC

## 2013-10-09 MED ORDER — OMEPRAZOLE 40 MG PO CPDR: 40.0000 mg | DELAYED_RELEASE_CAPSULE | Freq: Every day | ORAL | Status: DC

## 2013-10-09 NOTE — Patient Instructions (Signed)

## 2013-10-09 NOTE — Progress Notes (Signed)
 Chief Complaint:  Chief Complaint  Patient presents with  . Eye Problem    R eye "conjunctivitis" x2 days  . Gastrophageal Reflux    had problems previously- took omeprazole    HPI: Sherry Shepherd is a 28 y.o. female who is here for : 1. Right eye redness , crusty dc, swelling, nonitchy, some burning, NKI, she does not wear mucus. She has blurred vision from dc.  NO fevers, chills, no URI sxs, NO  Light sensititivity, she has had HAs but not sure realted.  2. GERD-was on omeprazole, she has had worsening sxs,  + stress, , eats late, not following diet.  3. Her husband and her are trying to get pregnant  Past Medical History  Diagnosis Date  . Allergy   . GERD (gastroesophageal reflux disease)    History reviewed. No pertinent past surgical history. History   Social History  . Marital Status: Married    Spouse Name: N/A    Number of Children: N/A  . Years of Education: N/A   Social History Main Topics  . Smoking status: Never Smoker   . Smokeless tobacco: Never Used  . Alcohol Use: No  . Drug Use: No  . Sexual Activity: None   Other Topics Concern  . None   Social History Narrative  . None   Family History  Problem Relation Age of Onset  . Diabetes Mother   . Diabetes Father    Allergies  Allergen Reactions  . Amoxicillin     REACTION: hives  . Cefprozil    Prior to Admission medications   Medication Sig Start Date End Date Taking? Authorizing Provider  Prenatal Vit-Fe Fumarate-FA (PRENATAL MULTIVITAMIN) TABS tablet Take 1 tablet by mouth daily at 12 noon.   Yes Historical Provider, MD  albuterol (PROVENTIL HFA;VENTOLIN HFA) 108 (90 BASE) MCG/ACT inhaler Inhale 2 puffs into the lungs every 4 (four) hours as needed for wheezing (cough, shortness of breath or wheezing.). 04/05/13   Ellison Carwin, MD  azithromycin (ZITHROMAX) 250 MG tablet Take 2 tabs PO x 1 dose, then 1 tab PO QD x 4 days 04/03/13   Collene Leyden, PA-C  chlorpheniramine-HYDROcodone  (TUSSIONEX PENNKINETIC ER) 10-8 MG/5ML LQCR Take 5 mLs by mouth every 12 (twelve) hours as needed (cough). 04/03/13   Heather Elnora Morrison, PA-C  guaiFENesin (MUCINEX) 600 MG 12 hr tablet Take 1,200 mg by mouth 2 (two) times daily.    Historical Provider, MD  HYDROcodone-homatropine (HYCODAN) 5-1.5 MG/5ML syrup Take 5 mLs by mouth every 4 (four) hours as needed for cough. 04/05/13   Ellison Carwin, MD  levofloxacin (LEVAQUIN) 500 MG tablet Take 1 tablet (500 mg total) by mouth daily. 04/05/13   Ellison Carwin, MD  Norgestim-Eth Radene Journey Triphasic (ORTHO TRI-CYCLEN, 28,) 0.18/0.215/0.25 MG-35 MCG TABS Take 1 tablet by mouth daily.      Historical Provider, MD  nystatin (MYCOSTATIN) 100000 UNIT/ML suspension Take 5 mLs (500,000 Units total) by mouth 4 (four) times daily. 09/10/12   Alveda Reasons, MD  omeprazole (PRILOSEC) 40 MG capsule Take 1 capsule (40 mg total) by mouth daily. 04/03/13   Collene Leyden, PA-C     ROS: The patient denies fevers, chills, night sweats, unintentional weight loss, chest pain, palpitations, wheezing, dyspnea on exertion, nausea, vomiting, abdominal pain, dysuria, hematuria, melena, numbness, weakness, or tingling.   All other systems have been reviewed and were otherwise negative with the exception of those mentioned in the HPI and as  above.    PHYSICAL EXAM: Filed Vitals:   10/09/13 1004  BP: 108/70  Pulse: 85  Temp: 98.5 F (36.9 C)  Resp: 16   Filed Vitals:   10/09/13 1004  Height: 5' 1.25" (1.556 m)  Weight: 140 lb 3.2 oz (63.594 kg)   Body mass index is 26.27 kg/(m^2).  General: Alert, no acute distress HEENT:  Normocephalic, atraumatic, oropharynx patent. EOMI, PERRLA, fundo exam normal, + erythemtous conjunctivitis, + min lid swelling Cardiovascular:  Regular rate and rhythm, no rubs murmurs or gallops.  No Carotid bruits, radial pulse intact. No pedal edema.  Respiratory: Clear to auscultation bilaterally.  No wheezes, rales, or rhonchi.  No cyanosis, no  use of accessory musculature GI: No organomegaly, abdomen is soft and non-tender, positive bowel sounds.  No masses. Skin: No rashes. Neurologic: Facial musculature symmetric. Psychiatric: Patient is appropriate throughout our interaction. Lymphatic: No cervical lymphadenopathy Musculoskeletal: Gait intact.   LABS: Results for orders placed in visit on 04/03/13  H. PYLORI ANTIBODY, IGG      Result Value Ref Range   H Pylori IgG 0.48    POCT CBC      Result Value Ref Range   WBC 7.7  4.6 - 10.2 K/uL   Lymph, poc 3.3  0.6 - 3.4   POC LYMPH PERCENT 43.3  10 - 50 %L   MID (cbc) 0.54  0 - 0.9   POC MID % 6.4  0 - 12 %M   POC Granulocyte 3.9  2 - 6.9   Granulocyte percent 50.3  37 - 80 %G   RBC 4.68  4.04 - 5.48 M/uL   Hemoglobin 13.2  12.2 - 16.2 g/dL   HCT, POC 41.5  37.7 - 47.9 %   MCV 88.7  80 - 97 fL   MCH, POC 28.2  27 - 31.2 pg   MCHC 31.8  31.8 - 35.4 g/dL   RDW, POC 13.6     Platelet Count, POC 306  142 - 424 K/uL   MPV 10.6  0 - 99.8 fL     EKG/XRAY:   Primary read interpreted by Dr. Marin Comment at [facility].   ASSESSMENT/PLAN: Encounter Diagnoses  Name Primary?  . Conjunctivitis Yes  . GERD (gastroesophageal reflux disease)    Rx Omeprazole Rx Tobradex gtt x 5 days F/u prn  Gross sideeffects, risk and benefits, and alternatives of medications d/w patient. Patient is aware that all medications have potential sideeffects and we are unable to predict every sideeffect or drug-drug interaction that may occur.  , [place], DO 10/09/2013 11:41 AM

## 2013-10-26 ENCOUNTER — Telehealth: Payer: Self-pay

## 2013-10-26 NOTE — Telephone Encounter (Signed)
Pt called stating she feels much better and is fever free. She wanted to know if she should finish the tamiflu before returning to work. i told her she would be fine to go to work tomorrow since she is fever free.

## 2014-08-04 NOTE — L&D Delivery Note (Signed)
Patient was C/C/+2 and pushed for 1hour 30 minutes with epidural.    Pt crying and uncomfortable with rectal pressure- and losing effort. Offered episiotomy with outlet VE to expedite delivery- all R/B/Alt d/w pt She agreed. VE placed and pulled one contraction with no pop offs  SVD  female infant, Apgars 8,9, weight P.   The patient had a third degree extention laceration repaired with 2-0 vicryl R. Fundus was firm. EBL was expected amount- 200 cc. Placenta was delivered intact. Vagina was clear.  Baby was vigorous and doing skin to skin with mother.  Sherry Shepherd A

## 2014-10-28 ENCOUNTER — Other Ambulatory Visit: Payer: Self-pay | Admitting: Family Medicine

## 2014-11-14 LAB — OB RESULTS CONSOLE HEPATITIS B SURFACE ANTIGEN: Hepatitis B Surface Ag: NEGATIVE

## 2014-11-14 LAB — OB RESULTS CONSOLE RPR: RPR: NONREACTIVE

## 2014-11-14 LAB — OB RESULTS CONSOLE RUBELLA ANTIBODY, IGM: Rubella: IMMUNE

## 2014-11-14 LAB — OB RESULTS CONSOLE ABO/RH: RH TYPE: POSITIVE

## 2014-11-14 LAB — OB RESULTS CONSOLE GC/CHLAMYDIA
CHLAMYDIA, DNA PROBE: NEGATIVE
GC PROBE AMP, GENITAL: NEGATIVE

## 2014-11-14 LAB — OB RESULTS CONSOLE HIV ANTIBODY (ROUTINE TESTING): HIV: NONREACTIVE

## 2014-11-14 LAB — OB RESULTS CONSOLE ANTIBODY SCREEN: ANTIBODY SCREEN: NEGATIVE

## 2015-03-30 ENCOUNTER — Encounter (HOSPITAL_COMMUNITY): Payer: Self-pay | Admitting: Obstetrics & Gynecology

## 2015-04-05 ENCOUNTER — Encounter: Payer: Commercial Managed Care - HMO | Attending: Obstetrics & Gynecology | Admitting: *Deleted

## 2015-04-05 ENCOUNTER — Ambulatory Visit (HOSPITAL_COMMUNITY)
Admission: RE | Admit: 2015-04-05 | Discharge: 2015-04-05 | Disposition: A | Discharge status: Home / Self Care | Payer: BLUE CROSS/BLUE SHIELD | Source: Ambulatory Visit | Attending: Obstetrics & Gynecology | Admitting: Obstetrics & Gynecology

## 2015-04-05 DIAGNOSIS — O24419 Gestational diabetes mellitus in pregnancy, unspecified control: Secondary | ICD-10-CM

## 2015-04-05 DIAGNOSIS — Z713 Dietary counseling and surveillance: Secondary | ICD-10-CM | POA: Diagnosis not present

## 2015-04-05 NOTE — Progress Notes (Addendum)
  Patient was seen on 04/05/15 for Gestational Diabetes self-management . The following learning objectives were met by the patient :   States the definition of Gestational Diabetes  States why dietary management is important in controlling blood glucose  Describes the effects of carbohydrates on blood glucose levels  Demonstrates ability to create a balanced meal plan  Demonstrates carbohydrate counting   States when to check blood glucose levels  Demonstrates proper blood glucose monitoring techniques  States the effect of stress and exercise on blood glucose levels  States the importance of limiting caffeine and abstaining from alcohol and smoking  Plan:  Aim for 2 Carb Choices per meal (30 grams) +/- 1 either way for breakfast Aim for 3 Carb Choices per meal (45 grams) +/- 1 either way from lunch and dinner Aim for 1-2 Carbs per snack Begin reading food labels for Total Carbohydrate and sugar grams of foods Consider  increasing your activity level by walking daily as tolerated Begin checking BG before breakfast and 1-2 hours after first bit of breakfast, lunch and dinner after  as directed by MD  Take medication  as directed by MD  Blood glucose monitor given: accu-chek verio flex Lot # [id number] Exp: 01/02/2016 Blood glucose reading: 64  Order for supplies called to [employer] [phone] Test Strip & lancets #100 Refill #10 DX GDM O24.419 for testing 4 times daily  Patient instructed to monitor glucose levels: FBS: 60 - <90 2 hour: <120  Patient received the following handouts:  Nutrition Diabetes and Pregnancy  Carbohydrate Counting List  Meal Planning worksheet  Patient will be seen for follow-up as needed.

## 2015-04-28 ENCOUNTER — Encounter (HOSPITAL_COMMUNITY): Payer: Self-pay | Admitting: *Deleted

## 2015-04-28 ENCOUNTER — Inpatient Hospital Stay (HOSPITAL_COMMUNITY)
Admission: AD | Admit: 2015-04-28 | Discharge: 2015-04-28 | Disposition: A | Discharge status: Home / Self Care | Payer: Commercial Managed Care - HMO | Source: Ambulatory Visit | Attending: Obstetrics and Gynecology | Admitting: Obstetrics and Gynecology

## 2015-04-28 DIAGNOSIS — O26899 Other specified pregnancy related conditions, unspecified trimester: Secondary | ICD-10-CM

## 2015-04-28 DIAGNOSIS — N898 Other specified noninflammatory disorders of vagina: Secondary | ICD-10-CM | POA: Diagnosis not present

## 2015-04-28 DIAGNOSIS — O26893 Other specified pregnancy related conditions, third trimester: Secondary | ICD-10-CM | POA: Diagnosis not present

## 2015-04-28 DIAGNOSIS — R109 Unspecified abdominal pain: Secondary | ICD-10-CM | POA: Insufficient documentation

## 2015-04-28 DIAGNOSIS — Z3A33 33 weeks gestation of pregnancy: Secondary | ICD-10-CM | POA: Insufficient documentation

## 2015-04-28 HISTORY — DX: Gestational diabetes mellitus in pregnancy, unspecified control: O24.419

## 2015-04-28 LAB — URINE MICROSCOPIC-ADD ON

## 2015-04-28 LAB — URINALYSIS, ROUTINE W REFLEX MICROSCOPIC
Bilirubin Urine: NEGATIVE
GLUCOSE, UA: NEGATIVE mg/dL
Ketones, ur: NEGATIVE mg/dL
Nitrite: NEGATIVE
Protein, ur: NEGATIVE mg/dL
SPECIFIC GRAVITY, URINE: 1.01 (ref 1.005–1.030)
Urobilinogen, UA: 0.2 mg/dL (ref 0.0–1.0)
pH: 6.5 (ref 5.0–8.0)

## 2015-04-28 LAB — AMNISURE RUPTURE OF MEMBRANE (ROM) NOT AT ARMC: AMNISURE: NEGATIVE

## 2015-04-28 NOTE — MAU Provider Note (Signed)
History     CSN: [id number]  Arrival date and time: 04/28/15 1235   None     Chief Complaint  Patient presents with  . Abdominal Pain  . Vaginal Discharge   HPI   Sherry Shepherd is a 29 y.o. female G1P0 at [redacted]w[redacted]d presenting with vaginal discharge. At 4 pm yesterday she felt some leaking after urinating. She did not put on a pad, however feels that her underwear have been more wet than usual.   She has continued to leak today and has had lower abdominal cramping, although this has been going on throughout her pregnancy.  The discharge seems more than just normal discharge. She denies vaginal bleeding.   + fetal movements.  No intercourse in the last 24 hours.   OB History    Gravida Para Term Preterm AB TAB SAB Ectopic Multiple Living   1 0        0      Past Medical History  Diagnosis Date  . Allergy   . GERD (gastroesophageal reflux disease)   . Gestational diabetes     Past Surgical History  Procedure Laterality Date  . Dental surgery      Family History  Problem Relation Age of Onset  . Diabetes Mother   . Diabetes Father     Social History  Substance Use Topics  . Smoking status: Never Smoker   . Smokeless tobacco: Never Used  . Alcohol Use: No    Allergies:  Allergies  Allergen Reactions  . Amoxicillin Hives  . Cefprozil Hives    Prescriptions prior to admission  Medication Sig Dispense Refill Last Dose  . glyBURIDE (DIABETA) 2.5 MG tablet Take 2.5 mg by mouth daily with breakfast.   04/27/2015 at Unknown time  . Prenatal Vit-Fe Fumarate-FA (PRENATAL MULTIVITAMIN) TABS tablet Take 1 tablet by mouth daily at 12 noon.   04/28/2015 at Unknown time  . ranitidine (ZANTAC) 150 MG tablet Take 150 mg by mouth 2 (two) times daily.   04/28/2015 at Unknown time  . albuterol (PROVENTIL HFA;VENTOLIN HFA) 108 (90 BASE) MCG/ACT inhaler Inhale 2 puffs into the lungs every 4 (four) hours as needed for wheezing (cough, shortness of breath or wheezing.). (Patient not  taking: Reported on 04/28/2015) 1 Inhaler 1 Not Taking  . azithromycin (ZITHROMAX) 250 MG tablet Take 2 tabs PO x 1 dose, then 1 tab PO QD x 4 days (Patient not taking: Reported on 04/28/2015) 6 tablet 0 Not Taking  . chlorpheniramine-HYDROcodone (TUSSIONEX PENNKINETIC ER) 10-8 MG/5ML LQCR Take 5 mLs by mouth every 12 (twelve) hours as needed (cough). (Patient not taking: Reported on 04/28/2015) 60 mL 0 Not Taking  . HYDROcodone-homatropine (HYCODAN) 5-1.5 MG/5ML syrup Take 5 mLs by mouth every 4 (four) hours as needed for cough. (Patient not taking: Reported on 04/28/2015) 120 mL 0 Not Taking  . levofloxacin (LEVAQUIN) 500 MG tablet Take 1 tablet (500 mg total) by mouth daily. (Patient not taking: Reported on 04/28/2015) 7 tablet 0 Not Taking  . nystatin (MYCOSTATIN) 100000 UNIT/ML suspension Take 5 mLs (500,000 Units total) by mouth 4 (four) times daily. (Patient not taking: Reported on 04/28/2015) 60 mL 0 Not Taking  . omeprazole (PRILOSEC) 40 MG capsule Take 1 capsule (40 mg total) by mouth daily. (Patient not taking: Reported on 04/28/2015) 30 capsule 1 Not Taking  . omeprazole (PRILOSEC) 40 MG capsule Take 1 capsule (40 mg total) by mouth daily. (Patient not taking: Reported on 04/28/2015) 30 capsule 11   .  omeprazole (PRILOSEC) 40 MG capsule Take 1 capsule (40 mg total) by mouth daily. PATIENT NEEDS OFFICE VISIT FOR ADDITIONAL REFILLS (Patient not taking: Reported on 04/28/2015) 30 capsule 0   . tobramycin-dexamethasone (TOBRADEX) ophthalmic solution Place 2 drops into the right eye every 6 (six) hours. X 5 days (Patient not taking: Reported on 04/28/2015) 5 mL 0    Results for orders placed or performed during the hospital encounter of 04/28/15 (from the past 48 hour(s))  Urinalysis, Routine w reflex microscopic (not at [facility])     Status: Abnormal   Collection Time: 04/28/15 12:59 PM  Result Value Ref Range   Color, Urine YELLOW YELLOW   APPearance CLEAR CLEAR   Specific Gravity, Urine 1.010 1.005 -  1.030   pH 6.5 5.0 - 8.0   Glucose, UA NEGATIVE NEGATIVE mg/dL   Hgb urine dipstick TRACE (A) NEGATIVE   Bilirubin Urine NEGATIVE NEGATIVE   Ketones, ur NEGATIVE NEGATIVE mg/dL   Protein, ur NEGATIVE NEGATIVE mg/dL   Urobilinogen, UA 0.2 0.0 - 1.0 mg/dL   Nitrite NEGATIVE NEGATIVE   Leukocytes, UA SMALL (A) NEGATIVE  Urine microscopic-add on     Status: Abnormal   Collection Time: 04/28/15 12:59 PM  Result Value Ref Range   Squamous Epithelial / LPF FEW (A) RARE   WBC, UA 0-2 <3 WBC/hpf   RBC / HPF 0-2 <3 RBC/hpf   Bacteria, UA RARE RARE  Amnisure rupture of membrane (rom)not at [facility]     Status: None   Collection Time: 04/28/15  2:00 PM  Result Value Ref Range   Amnisure ROM NEGATIVE     Review of Systems  Constitutional: Negative for fever and chills.  Gastrointestinal: Positive for abdominal pain.  Genitourinary: Negative for dysuria.   Physical Exam   Blood pressure 114/70, pulse 116, temperature 98.5 F (36.9 C), temperature source Oral, resp. rate 16, height 5' (1.524 m), weight 78.472 kg (173 lb), last menstrual period 09/07/2014.  Physical Exam  Constitutional: She is oriented to person, place, and time. She appears well-developed and well-nourished. No distress.  HENT:  Head: Normocephalic.  Eyes: Pupils are equal, round, and reactive to light.  Neck: Neck supple.  Respiratory: Effort normal.  GI: Soft.  Genitourinary:  Speculum exam: Vagina - Small amount of creamy, white discharge, no odor. No pooling of fluid in the vagina.  Cervix - No contact bleeding Bimanual exam: Cervix closed, soft.  Uterus non tender, gravid  Adnexa non tender, no masses bilaterally amnisure collected  Chaperone present for exam.  Musculoskeletal: Normal range of motion.  Neurological: She is alert and oriented to person, place, and time.  Skin: Skin is warm. She is not diaphoretic.  Psychiatric: Her behavior is normal.   Fetal Tracing: Baseline: 130 bpm  Variability:  Moderate  Accelerations: 15x15 Decelerations: None Toco: 1 contraction noted   MAU Course  Procedures None   MDM Discussed HPI> Labs> and fetal tracing.  amnisure negative: no fluid in the vagina.   Assessment and Plan   A:    ICD-9-CM ICD-10-CM   1. Vaginal discharge in pregnancy, third trimester 646.83 O26.893    623.5 N89.8   2. Abdominal cramping affecting pregnancy 646.80 O99.89    789.00 R10.9    P:  Discharge home in stable condition Preterm labor precautions Return to MAU with further leaking, increased pain Follow up with OB next week Kick counts   Lezlie Lye, NP 04/28/2015 1:56 PM

## 2015-04-28 NOTE — MAU Note (Signed)
Patient presents at [redacted] weeks gestation with c/o abdominal cramping X 2-3 weeks that became consistent today and a gush of fluid yesterday at 1600 followed by persistent wetness today. Fetus active yesterday but not as much today. Denies bleeding.

## 2015-05-09 LAB — OB RESULTS CONSOLE GBS: STREP GROUP B AG: NEGATIVE

## 2015-05-31 ENCOUNTER — Encounter (HOSPITAL_COMMUNITY): Payer: Self-pay | Admitting: *Deleted

## 2015-05-31 ENCOUNTER — Other Ambulatory Visit: Payer: Self-pay | Admitting: Obstetrics and Gynecology

## 2015-05-31 ENCOUNTER — Telehealth (HOSPITAL_COMMUNITY): Payer: Self-pay | Admitting: *Deleted

## 2015-05-31 NOTE — Telephone Encounter (Signed)
Preadmission screen  

## 2015-06-01 ENCOUNTER — Telehealth (HOSPITAL_COMMUNITY): Payer: Self-pay | Admitting: *Deleted

## 2015-06-01 ENCOUNTER — Encounter (HOSPITAL_COMMUNITY): Payer: Self-pay | Admitting: *Deleted

## 2015-06-01 NOTE — Telephone Encounter (Signed)
Preadmission screen  

## 2015-06-07 ENCOUNTER — Encounter (HOSPITAL_COMMUNITY): Payer: Self-pay

## 2015-06-07 ENCOUNTER — Inpatient Hospital Stay (HOSPITAL_COMMUNITY)
Admission: RE | Admit: 2015-06-07 | Discharge: 2015-06-10 | DRG: 775 | Disposition: A | Discharge status: Home / Self Care | Payer: Commercial Managed Care - HMO | Source: Ambulatory Visit | Attending: Obstetrics and Gynecology | Admitting: Obstetrics and Gynecology

## 2015-06-07 DIAGNOSIS — O411090 Infection of amniotic sac and membranes, unspecified, unspecified trimester, not applicable or unspecified: Secondary | ICD-10-CM | POA: Diagnosis present

## 2015-06-07 DIAGNOSIS — K219 Gastro-esophageal reflux disease without esophagitis: Secondary | ICD-10-CM | POA: Diagnosis present

## 2015-06-07 DIAGNOSIS — O99214 Obesity complicating childbirth: Secondary | ICD-10-CM | POA: Diagnosis present

## 2015-06-07 DIAGNOSIS — E669 Obesity, unspecified: Secondary | ICD-10-CM | POA: Diagnosis present

## 2015-06-07 DIAGNOSIS — O9962 Diseases of the digestive system complicating childbirth: Secondary | ICD-10-CM | POA: Diagnosis present

## 2015-06-07 DIAGNOSIS — Z6835 Body mass index (BMI) 35.0-35.9, adult: Secondary | ICD-10-CM

## 2015-06-07 DIAGNOSIS — O9902 Anemia complicating childbirth: Secondary | ICD-10-CM | POA: Diagnosis present

## 2015-06-07 DIAGNOSIS — E0590 Thyrotoxicosis, unspecified without thyrotoxic crisis or storm: Secondary | ICD-10-CM | POA: Diagnosis present

## 2015-06-07 DIAGNOSIS — Z3A39 39 weeks gestation of pregnancy: Secondary | ICD-10-CM

## 2015-06-07 DIAGNOSIS — O99284 Endocrine, nutritional and metabolic diseases complicating childbirth: Secondary | ICD-10-CM | POA: Diagnosis present

## 2015-06-07 DIAGNOSIS — Z3490 Encounter for supervision of normal pregnancy, unspecified, unspecified trimester: Secondary | ICD-10-CM

## 2015-06-07 LAB — CBC
HEMATOCRIT: 33.2 % — AB (ref 36.0–46.0)
HEMOGLOBIN: 10.8 g/dL — AB (ref 12.0–15.0)
MCH: 26.9 pg (ref 26.0–34.0)
MCHC: 32.5 g/dL (ref 30.0–36.0)
MCV: 82.8 fL (ref 78.0–100.0)
Platelets: 279 10*3/uL (ref 150–400)
RBC: 4.01 MIL/uL (ref 3.87–5.11)
RDW: 16.9 % — AB (ref 11.5–15.5)
WBC: 12.9 10*3/uL — AB (ref 4.0–10.5)

## 2015-06-07 LAB — TYPE AND SCREEN
ABO/RH(D): O POS
ANTIBODY SCREEN: NEGATIVE

## 2015-06-07 LAB — ABO/RH: ABO/RH(D): O POS

## 2015-06-07 LAB — RPR: RPR: NONREACTIVE

## 2015-06-07 LAB — GLUCOSE, RANDOM: Glucose, Bld: 74 mg/dL (ref 65–99)

## 2015-06-07 MED ORDER — OXYTOCIN 40 UNITS IN LACTATED RINGERS INFUSION - SIMPLE MED
62.5000 mL/h | INTRAVENOUS | Status: DC
  Filled 2015-06-07: qty 1000

## 2015-06-07 MED ORDER — CITRIC ACID-SODIUM CITRATE 334-500 MG/5ML PO SOLN: 30.0000 mL | ORAL | Status: DC | PRN

## 2015-06-07 MED ORDER — PROMETHAZINE HCL 25 MG/ML IJ SOLN
12.5000 mg | Freq: Four times a day (QID) | INTRAMUSCULAR | Status: DC | PRN
  Administered 2015-06-07: 12.5 mg via INTRAVENOUS
  Filled 2015-06-07: qty 1

## 2015-06-07 MED ORDER — OXYTOCIN BOLUS FROM INFUSION
500.0000 mL | INTRAVENOUS | Status: DC
  Administered 2015-06-09: 500 mL via INTRAVENOUS

## 2015-06-07 MED ORDER — LACTATED RINGERS IV SOLN
INTRAVENOUS | Status: DC
  Administered 2015-06-07 – 2015-06-08 (×6): via INTRAVENOUS

## 2015-06-07 MED ORDER — BUTORPHANOL TARTRATE 1 MG/ML IJ SOLN
1.0000 mg | INTRAMUSCULAR | Status: DC | PRN
  Administered 2015-06-07: 1 mg via INTRAVENOUS
  Filled 2015-06-07: qty 1

## 2015-06-07 MED ORDER — TERBUTALINE SULFATE 1 MG/ML IJ SOLN
0.2500 mg | Freq: Once | INTRAMUSCULAR | Status: DC | PRN
  Filled 2015-06-07: qty 1

## 2015-06-07 MED ORDER — PHENYLEPHRINE 40 MCG/ML (10ML) SYRINGE FOR IV PUSH (FOR BLOOD PRESSURE SUPPORT)
80.0000 ug | PREFILLED_SYRINGE | INTRAVENOUS | Status: DC | PRN
  Filled 2015-06-07: qty 2
  Filled 2015-06-07: qty 20

## 2015-06-07 MED ORDER — OXYCODONE-ACETAMINOPHEN 5-325 MG PO TABS: 1.0000 | ORAL_TABLET | ORAL | Status: DC | PRN

## 2015-06-07 MED ORDER — ONDANSETRON HCL 4 MG/2ML IJ SOLN
4.0000 mg | Freq: Four times a day (QID) | INTRAMUSCULAR | Status: DC | PRN
  Administered 2015-06-08: 4 mg via INTRAVENOUS
  Filled 2015-06-07: qty 2

## 2015-06-07 MED ORDER — DIPHENHYDRAMINE HCL 50 MG/ML IJ SOLN: 12.5000 mg | INTRAMUSCULAR | Status: DC | PRN

## 2015-06-07 MED ORDER — LIDOCAINE HCL (PF) 1 % IJ SOLN
30.0000 mL | INTRAMUSCULAR | Status: DC | PRN
  Administered 2015-06-08: 30 mL via SUBCUTANEOUS
  Filled 2015-06-07: qty 30

## 2015-06-07 MED ORDER — BUTORPHANOL TARTRATE 1 MG/ML IJ SOLN
2.0000 mg | Freq: Once | INTRAMUSCULAR | Status: AC
  Administered 2015-06-07: 2 mg via INTRAVENOUS
  Filled 2015-06-07: qty 2

## 2015-06-07 MED ORDER — ZOLPIDEM TARTRATE 5 MG PO TABS
5.0000 mg | ORAL_TABLET | Freq: Every evening | ORAL | Status: DC | PRN
  Administered 2015-06-08: 5 mg via ORAL
  Filled 2015-06-07: qty 1

## 2015-06-07 MED ORDER — ACETAMINOPHEN 325 MG PO TABS
650.0000 mg | ORAL_TABLET | ORAL | Status: DC | PRN
  Administered 2015-06-08: 650 mg via ORAL
  Filled 2015-06-07: qty 2

## 2015-06-07 MED ORDER — TERBUTALINE SULFATE 1 MG/ML IJ SOLN
0.2500 mg | Freq: Once | INTRAMUSCULAR | Status: AC | PRN
  Administered 2015-06-07: 0.25 mg via SUBCUTANEOUS
  Filled 2015-06-07: qty 1

## 2015-06-07 MED ORDER — LACTATED RINGERS IV SOLN
500.0000 mL | INTRAVENOUS | Status: DC | PRN
Start: 2015-06-07 — End: 2015-06-09
  Administered 2015-06-07: 500 mL via INTRAVENOUS
  Administered 2015-06-08: 1000 mL via INTRAVENOUS

## 2015-06-07 MED ORDER — OXYCODONE-ACETAMINOPHEN 5-325 MG PO TABS: 2.0000 | ORAL_TABLET | ORAL | Status: DC | PRN

## 2015-06-07 MED ORDER — MISOPROSTOL 25 MCG QUARTER TABLET
25.0000 ug | ORAL_TABLET | ORAL | Status: DC | PRN
  Administered 2015-06-07 (×3): 25 ug via VAGINAL
  Filled 2015-06-07 (×2): qty 0.25
  Filled 2015-06-07: qty 1
  Filled 2015-06-07: qty 0.25

## 2015-06-07 MED ORDER — FENTANYL 2.5 MCG/ML BUPIVACAINE 1/10 % EPIDURAL INFUSION (WH - ANES)
14.0000 mL/h | INTRAMUSCULAR | Status: DC | PRN
  Administered 2015-06-08: 11 mL/h via EPIDURAL
  Administered 2015-06-08: 14 mL/h via EPIDURAL
  Filled 2015-06-07 (×2): qty 125

## 2015-06-07 MED ORDER — EPHEDRINE 5 MG/ML INJ
10.0000 mg | INTRAVENOUS | Status: DC | PRN
  Filled 2015-06-07: qty 2

## 2015-06-07 NOTE — Progress Notes (Signed)
Patient ID: Sherry Shepherd, female   DOB: Nov 16, 1985, 29 y.o.   MRN: [id number]  S: Pt feeling discomfort with contractions. Earlier patient required terbutaline for tachysystole with subsequent lates. Now tracing category 1  O:  Filed Vitals:   06/07/15 1400 06/07/15 1558 06/07/15 1800 06/07/15 1803  BP:  103/70  140/78  Pulse:  89  99  Temp:  98.3 F (36.8 C)    TempSrc:  Oral    Resp: 20 22 20    Height:      Weight:       Cvx 1-2/50/-3, soft toco irregular FHR 150 category 1 tracing  Foley catheter placed through the cervix without difficulty  A/P 1) FWB reassuring 2) Epidural on request

## 2015-06-07 NOTE — H&P (Signed)
29 y.o. [redacted]w[redacted]d  G1P0 comes in for schedule IOL d/t GDMA2.  Otherwise has good fetal movement and no bleeding.  Past Medical History  Diagnosis Date  . Allergy   . GERD (gastroesophageal reflux disease)   . Hx of varicella     Past Surgical History  Procedure Laterality Date  . Dental surgery      OB History  Gravida Para Term Preterm AB SAB TAB Ectopic Multiple Living  1 0        0    # Outcome Date GA Lbr Len/2nd Weight Sex Delivery Anes PTL Lv  1 Current               Social History   Social History  . Marital Status: Married    Spouse Name: N/A  . Number of Children: N/A  . Years of Education: N/A   Occupational History  . Not on file.   Social History Main Topics  . Smoking status: Never Smoker   . Smokeless tobacco: Never Used  . Alcohol Use: No  . Drug Use: No  . Sexual Activity: Yes    Birth Control/ Protection: None   Other Topics Concern  . Not on file   Social History Narrative   Amoxicillin and Cefprozil    Prenatal Transfer Tool  Maternal Diabetes: Yes:  Diabetes Type:  Insulin/Medication controlled Genetic Screening: Declined Maternal Ultrasounds/Referrals: Normal (previa resolved) Fetal Ultrasounds or other Referrals:  None Maternal Substance Abuse:  No Significant Maternal Medications:  Meds include: Other: glyburide 2.5mg  qhs Significant Maternal Lab Results: Lab values include: Group B Strep negative  Other PNC: GMDA2    There were no vitals filed for this visit.   Lungs/Cor:  NAD Abdomen:  soft, gravid Ex:  no cords, erythema SVE:  1/50/-2 per office exam FHTs:  120, good STV, NST R Toco:  irritability   A/P   Admit for IOL d/t GDMA2 well controlled  GBS Neg  [place] @36  weeks 58% (6#2)  Cytotec for cervical ripening  Other routine care  [place], Casimer Bilis

## 2015-06-08 ENCOUNTER — Encounter (HOSPITAL_COMMUNITY): Payer: Self-pay

## 2015-06-08 ENCOUNTER — Inpatient Hospital Stay (HOSPITAL_COMMUNITY): Payer: Commercial Managed Care - HMO | Admitting: Anesthesiology

## 2015-06-08 MED ORDER — CLINDAMYCIN PHOSPHATE 900 MG/50ML IV SOLN
900.0000 mg | Freq: Once | INTRAVENOUS | Status: AC
Start: 2015-06-08 — End: 2015-06-09
  Administered 2015-06-08: 900 mg via INTRAVENOUS
  Filled 2015-06-08: qty 50

## 2015-06-08 MED ORDER — TERBUTALINE SULFATE 1 MG/ML IJ SOLN
0.2500 mg | Freq: Once | INTRAMUSCULAR | Status: DC | PRN
  Filled 2015-06-08: qty 1

## 2015-06-08 MED ORDER — OXYTOCIN 40 UNITS IN LACTATED RINGERS INFUSION - SIMPLE MED
1.0000 m[IU]/min | INTRAVENOUS | Status: DC
  Administered 2015-06-08: 2 m[IU]/min via INTRAVENOUS

## 2015-06-08 MED ORDER — LIDOCAINE HCL (PF) 1 % IJ SOLN
INTRAMUSCULAR | Status: DC | PRN
  Administered 2015-06-08: 3 mL via EPIDURAL
  Administered 2015-06-08: 4 mL via EPIDURAL

## 2015-06-08 NOTE — Anesthesia Procedure Notes (Signed)
Epidural Patient location during procedure: OB Start time: 06/08/2015 11:16 AM  Staffing Anesthesiologist: Josephine Igo Performed by: anesthesiologist   Preanesthetic Checklist Completed: patient identified, site marked, surgical consent, pre-op evaluation, timeout performed, IV checked, risks and benefits discussed and monitors and equipment checked  Epidural Patient position: sitting Prep: site prepped and draped and DuraPrep Patient monitoring: continuous pulse ox and blood pressure Approach: midline Location: L3-L4 Injection technique: LOR air  Needle:  Needle type: Tuohy  Needle gauge: 17 G Needle length: 9 cm and 9 Needle insertion depth: 6 cm Catheter type: closed end flexible Catheter size: 19 Gauge Catheter at skin depth: 11 cm Test dose: negative and Other  Assessment Events: blood not aspirated, injection not painful, no injection resistance, negative IV test and no paresthesia  Additional Notes Patient identified. Risks and benefits discussed including failed block, incomplete  Pain control, post dural puncture headache, nerve damage, paralysis, blood pressure Changes, nausea, vomiting, reactions to medications-both toxic and allergic and post Partum back pain. All questions were answered. Patient expressed understanding and wished to proceed. Sterile technique was used throughout procedure. Epidural site was Dressed with sterile barrier dressing. No paresthesias, signs of intravascular injection Or signs of intrathecal spread were encountered.  Patient was more comfortable after the epidural was dosed. Please see RN's note for documentation of vital signs and FHR which are stable.

## 2015-06-08 NOTE — Anesthesia Preprocedure Evaluation (Signed)
Anesthesia Evaluation  Patient identified by MRN, date of birth, ID band Patient awake    Reviewed: Allergy & Precautions, NPO status , Patient's Chart, lab work & pertinent test results  Airway Mallampati: III  TM Distance: >3 FB Neck ROM: Full    Dental no notable dental hx. (+) Teeth Intact   Pulmonary neg pulmonary ROS,    Pulmonary exam normal breath sounds clear to auscultation       Cardiovascular negative cardio ROS   Rhythm:Regular Rate:Normal - Systolic murmurs    Neuro/Psych negative neurological ROS  negative psych ROS   GI/Hepatic Neg liver ROS, GERD  Medicated and Controlled,  Endo/Other  Hyperthyroidism Obesity  Renal/GU negative Renal ROS  negative genitourinary   Musculoskeletal negative musculoskeletal ROS (+)   Abdominal (+) + obese,   Peds  Hematology  (+) anemia ,   Anesthesia Other Findings   Reproductive/Obstetrics (+) Pregnancy                             Anesthesia Physical Anesthesia Plan  ASA: II  Anesthesia Plan: Epidural   Post-op Pain Management:    Induction:   Airway Management Planned: Natural Airway  Additional Equipment:   Intra-op Plan:   Post-operative Plan:   Informed Consent: I have reviewed the patients History and Physical, chart, labs and discussed the procedure including the risks, benefits and alternatives for the proposed anesthesia with the patient or authorized representative who has indicated his/her understanding and acceptance.     Plan Discussed with: Anesthesiologist  Anesthesia Plan Comments:         Anesthesia Quick Evaluation

## 2015-06-09 ENCOUNTER — Encounter (HOSPITAL_COMMUNITY): Payer: Self-pay

## 2015-06-09 LAB — CBC
HEMATOCRIT: 28 % — AB (ref 36.0–46.0)
HEMOGLOBIN: 9 g/dL — AB (ref 12.0–15.0)
MCH: 27.1 pg (ref 26.0–34.0)
MCHC: 32.1 g/dL (ref 30.0–36.0)
MCV: 84.3 fL (ref 78.0–100.0)
Platelets: 218 10*3/uL (ref 150–400)
RBC: 3.32 MIL/uL — ABNORMAL LOW (ref 3.87–5.11)
RDW: 17.3 % — AB (ref 11.5–15.5)
WBC: 14.6 10*3/uL — AB (ref 4.0–10.5)

## 2015-06-09 MED ORDER — PRENATAL MULTIVITAMIN CH
1.0000 | ORAL_TABLET | Freq: Every day | ORAL | Status: DC
  Administered 2015-06-09: 1 via ORAL
  Filled 2015-06-09: qty 1

## 2015-06-09 MED ORDER — MEASLES, MUMPS & RUBELLA VAC ~~LOC~~ INJ
0.5000 mL | INJECTION | Freq: Once | SUBCUTANEOUS | Status: DC
  Filled 2015-06-09: qty 0.5

## 2015-06-09 MED ORDER — BENZOCAINE-MENTHOL 20-0.5 % EX AERO
1.0000 "application " | INHALATION_SPRAY | CUTANEOUS | Status: DC | PRN
  Administered 2015-06-09 (×2): 1 via TOPICAL
  Filled 2015-06-09 (×2): qty 56

## 2015-06-09 MED ORDER — SODIUM CHLORIDE 0.9 % IJ SOLN: 3.0000 mL | INTRAMUSCULAR | Status: DC | PRN

## 2015-06-09 MED ORDER — SODIUM CHLORIDE 0.9 % IV SOLN: 250.0000 mL | INTRAVENOUS | Status: DC | PRN

## 2015-06-09 MED ORDER — DIBUCAINE 1 % RE OINT: 1.0000 "application " | TOPICAL_OINTMENT | RECTAL | Status: DC | PRN

## 2015-06-09 MED ORDER — METHYLERGONOVINE MALEATE 0.2 MG PO TABS: 0.2000 mg | ORAL_TABLET | ORAL | Status: DC | PRN

## 2015-06-09 MED ORDER — METHYLERGONOVINE MALEATE 0.2 MG/ML IJ SOLN: 0.2000 mg | INTRAMUSCULAR | Status: DC | PRN

## 2015-06-09 MED ORDER — FERROUS SULFATE 325 (65 FE) MG PO TABS
325.0000 mg | ORAL_TABLET | Freq: Two times a day (BID) | ORAL | Status: DC
  Administered 2015-06-09 – 2015-06-10 (×3): 325 mg via ORAL
  Filled 2015-06-09 (×3): qty 1

## 2015-06-09 MED ORDER — SENNOSIDES-DOCUSATE SODIUM 8.6-50 MG PO TABS
2.0000 | ORAL_TABLET | ORAL | Status: DC
  Administered 2015-06-09: 2 via ORAL
  Filled 2015-06-09: qty 2

## 2015-06-09 MED ORDER — LANOLIN HYDROUS EX OINT: TOPICAL_OINTMENT | CUTANEOUS | Status: DC | PRN

## 2015-06-09 MED ORDER — WITCH HAZEL-GLYCERIN EX PADS: 1.0000 "application " | MEDICATED_PAD | CUTANEOUS | Status: DC | PRN

## 2015-06-09 MED ORDER — SODIUM CHLORIDE 0.9 % IJ SOLN: 3.0000 mL | Freq: Two times a day (BID) | INTRAMUSCULAR | Status: DC

## 2015-06-09 MED ORDER — SIMETHICONE 80 MG PO CHEW: 80.0000 mg | CHEWABLE_TABLET | ORAL | Status: DC | PRN

## 2015-06-09 MED ORDER — FAMOTIDINE 20 MG PO TABS
20.0000 mg | ORAL_TABLET | Freq: Two times a day (BID) | ORAL | Status: DC
  Administered 2015-06-09 – 2015-06-10 (×3): 20 mg via ORAL
  Filled 2015-06-09 (×3): qty 1

## 2015-06-09 MED ORDER — TETANUS-DIPHTH-ACELL PERTUSSIS 5-2.5-18.5 LF-MCG/0.5 IM SUSP: 0.5000 mL | Freq: Once | INTRAMUSCULAR | Status: DC

## 2015-06-09 MED ORDER — OXYCODONE-ACETAMINOPHEN 5-325 MG PO TABS: 2.0000 | ORAL_TABLET | ORAL | Status: DC | PRN

## 2015-06-09 MED ORDER — OXYCODONE-ACETAMINOPHEN 5-325 MG PO TABS
1.0000 | ORAL_TABLET | ORAL | Status: DC | PRN
Start: 2015-06-09 — End: 2015-06-10
  Administered 2015-06-09 – 2015-06-10 (×4): 1 via ORAL
  Filled 2015-06-09 (×4): qty 1

## 2015-06-09 MED ORDER — ZOLPIDEM TARTRATE 5 MG PO TABS
5.0000 mg | ORAL_TABLET | Freq: Every evening | ORAL | Status: DC | PRN
Start: 2015-06-09 — End: 2015-06-10

## 2015-06-09 MED ORDER — ACETAMINOPHEN 325 MG PO TABS: 650.0000 mg | ORAL_TABLET | ORAL | Status: DC | PRN

## 2015-06-09 MED ORDER — MAGNESIUM HYDROXIDE 400 MG/5ML PO SUSP: 30.0000 mL | ORAL | Status: DC | PRN

## 2015-06-09 MED ORDER — DIPHENHYDRAMINE HCL 25 MG PO CAPS: 25.0000 mg | ORAL_CAPSULE | Freq: Four times a day (QID) | ORAL | Status: DC | PRN

## 2015-06-09 MED ORDER — ONDANSETRON HCL 4 MG/2ML IJ SOLN: 4.0000 mg | INTRAMUSCULAR | Status: DC | PRN

## 2015-06-09 MED ORDER — IBUPROFEN 800 MG PO TABS
800.0000 mg | ORAL_TABLET | Freq: Three times a day (TID) | ORAL | Status: DC
  Administered 2015-06-09 – 2015-06-10 (×5): 800 mg via ORAL
  Filled 2015-06-09 (×5): qty 1

## 2015-06-09 MED ORDER — ONDANSETRON HCL 4 MG PO TABS: 4.0000 mg | ORAL_TABLET | ORAL | Status: DC | PRN

## 2015-06-09 NOTE — Discharge Summary (Signed)
Obstetric Discharge Summary Reason for Admission: induction of labor Prenatal Procedures: none Intrapartum Procedures: vacuum outlet Postpartum Procedures: none Complications-Operative and Postpartum: 3 degree perineal laceration HEMOGLOBIN  Date Value Ref Range Status  06/07/2015 10.8* 12.0 - 15.0 g/dL Final  04/03/2013 13.2 12.2 - 16.2 g/dL Final   HCT  Date Value Ref Range Status  06/07/2015 33.2* 36.0 - 46.0 % Final   HCT, POC  Date Value Ref Range Status  04/03/2013 41.5 37.7 - 47.9 % Final    Discharge Diagnoses: Term Pregnancy-delivered and Amnionitis  Discharge Information: Date: 06/09/2015 Activity: pelvic rest Diet: routine Medications: Ibuprofen and Iron Condition: stable Instructions: refer to practice specific booklet Discharge to: home Follow-up Information    Follow up with Camila Maita A, MD.   Specialty:  Obstetrics and Gynecology   Contact information:   [address] [place] [address] [phone]       Newborn Data: Live born female  Birth Weight:   APGAR: 8, 9  Home with mother.  Matsue Strom A 06/09/2015, 12:13 AM

## 2015-06-09 NOTE — Anesthesia Postprocedure Evaluation (Signed)
  Anesthesia Post-op Note  Patient: Sherry Shepherd  Procedure(s) Performed: * No procedures listed *  Patient Location: Mother/Baby  Anesthesia Type:Epidural  Level of Consciousness: awake and alert   Airway and Oxygen Therapy: Patient Spontanous Breathing  Post-op Pain: mild  Post-op Assessment: Post-op Vital signs reviewed, Patient's Cardiovascular Status Stable, Respiratory Function Stable, No signs of Nausea or vomiting, Pain level controlled, No headache, Spinal receding and Patient able to bend at knees              Post-op Vital Signs: Reviewed  Last Vitals:  Filed Vitals:   06/09/15 0633  BP: 99/73  Pulse: 80  Temp: 36.7 C  Resp: 18    Complications: No apparent anesthesia complications

## 2015-06-09 NOTE — Lactation Note (Signed)
This note was copied from the chart of Sherry Shepherd. Lactation Consultation Note; Called to assist with latch due to low blood sugar, Baby rooting. Mom needs much assist with positioning baby at the breast  Baby on and off the breast. Mom easily able to hand express Colostrum. Off to sleep- left skin to skin with mom. BF brochure given with resources for support after DC. No questions at present. To call for assist prn  Patient Name: Sherry Shepherd [id number] Date: 06/09/2015 Reason for consult: Initial assessment;Other (Comment) (diabetic mom')   Maternal Data Formula Feeding for Exclusion: No Has patient been taught Hand Expression?: Yes Does the patient have breastfeeding experience prior to this delivery?: No  Feeding Feeding Type: Breast Fed Length of feed: 15 min  LATCH Score/Interventions Latch: Repeated attempts needed to sustain latch, nipple held in mouth throughout feeding, stimulation needed to elicit sucking reflex. Intervention(s): Adjust position;Assist with latch;Breast massage;Breast compression (hand pump given, Mom able to express colostrum)  Audible Swallowing: A few with stimulation Intervention(s): Hand expression Intervention(s): Hand expression  Type of Nipple: Everted at rest and after stimulation  Comfort (Breast/Nipple): Soft / non-tender     Hold (Positioning): Full assist, staff holds infant at breast  LATCH Score: 6  Lactation Tools Discussed/Used     Consult Status Consult Status: Follow-up Date: 06/10/15 Follow-up type: In-patient    Truddie Crumble 06/09/2015, 1:47 PM

## 2015-06-09 NOTE — Progress Notes (Signed)
Patient is eating, ambulating, voiding.  Pain control is good.  Filed Vitals:   06/09/15 0100 06/09/15 0130 06/09/15 0230 06/09/15 0633  BP: 119/88 133/77 101/66 99/73  Pulse: 81 97 88 80  Temp:  98.3 F (36.8 C) 98.4 F (36.9 C) 98.1 F (36.7 C)  TempSrc:  Axillary Axillary Axillary  Resp: 18 18 18 18   Height:      Weight:      SpO2:        Fundus firm Perineum without swelling.  Lab Results  Component Value Date   WBC 14.6* 06/09/2015   HGB 9.0* 06/09/2015   HCT 28.0* 06/09/2015   MCV 84.3 06/09/2015   PLT 218 06/09/2015    --/--/O POS, O POS (11/03 0200)/RI  A/P Post partum day 1.  Routine care.  Expect d/c tomorrow.    Krysten Veronica A

## 2015-06-10 NOTE — Lactation Note (Signed)
This note was copied from the chart of Girl Ann-Marie Kluge. Lactation Consultation Note    Patient Name: Girl Levia Waltermire [id number] Date: 06/10/2015 Reason for consult: Follow-up assessment;Difficult latch   Follow up with first time parents and baby girl [place]. Alden Benjamin is 43 hours old and has had 5 BF for 8-15 minutes and 3 BF attempts, 6 voids and 6 stools in last 24 hours. She is at an 7% weight loss. Latch scores have been 5-7. Mom reports infant has been sleepy and needs a lot of stimulation to keep awake at breast. Infant with jaundice this morning that does not require treatment. Mom reported she used a NS x 1 feeding last night with assistance of RN, but she did not like the way it felt. Told her if she continues to use NS, she needs to call [facility] for follow up appointment. Mom with large fleshy breast and short shafted nipples that are flat to begin with but evert easily with stimulation. Gave mom Inverted nipple shells to wear between feeds during day. Moms nipple tissue easily compressible. Infant awakened to feed. Infant may have short posterior frenulum as unable to raise tongue to roof of mouth. It took her several seconds for her to begin sucking on gloved finger, she did extend tongue over gum line and cupped tongue around finger with sucking. Mentioned possible short posterior frenulum to mom, mom reports her brother born with tongue tie. Dad assisted mom in latching infant to right breast in football hold, he did well with compressing breast, infant was unable to get a deep latch, showed parents tea cup hold to assist with latch and infant was able to obtain deeper latch. Infant nursed well for 15 minutes, she would come off at times and required burping, but would return easily. She did have intermittent swallows. Parents were pleased and felt that this was a better feeding. Infant was examined by Ped and then went back to breast on left side with vigorous intermittent suckling, mom was able to  stimulate infant to maintain suckling. Encouraged mom to compress/massage breast with feeding. Reviewed BF Information in Taking Care of Baby and Me, Engorgement prevention, stomach size, nutritional needs of NB, milk coming to volume, nipple care, transitioning of stools, I/O and maintaining record, NL NB feeding behaviors, feeding infant 8-12 x in 24 hours. Baby to be seen by Ped on Tuesday for follow up. [facility] Brochure reviewed including phone #, OP Services, Support Groups and BF Resources. Enc parents to call with questions/concerns.   Maternal Data Formula Feeding for Exclusion: No Does the patient have breastfeeding experience prior to this delivery?: No  Feeding Feeding Type: Breast Fed Length of feed: 25 min  LATCH Score/Interventions Latch: Repeated attempts needed to sustain latch, nipple held in mouth throughout feeding, stimulation needed to elicit sucking reflex. Intervention(s): Adjust position;Assist with latch;Breast compression;Breast massage  Audible Swallowing: Spontaneous and intermittent Intervention(s): Skin to skin Intervention(s): Skin to skin;Hand expression  Type of Nipple: Everted at rest and after stimulation (Short Shafted on the flatter side, easily everts)  Comfort (Breast/Nipple): Soft / non-tender     Hold (Positioning): Assistance needed to correctly position infant at breast and maintain latch. Intervention(s): Breastfeeding basics reviewed;Support Pillows;Position options;Skin to skin  LATCH Score: 8  Lactation Tools Discussed/Used Tools: Shells WIC Program: No Pump Review: Setup, frequency, and cleaning;Milk Storage   Consult Status Consult Status: Complete Follow-up type: Call as needed    Donn Pierini 06/10/2015, 9:20 AM

## 2015-06-10 NOTE — Progress Notes (Signed)
Patient is eating, ambulating, voiding.  Pain control is good.  Filed Vitals:   06/09/15 1438 06/09/15 1145 06/09/15 1816 06/10/15 0610  BP: 99/73 105/80 110/74 109/78  Pulse: 80 106 93 88  Temp: 98.1 F (36.7 C) 97.9 F (36.6 C) 97.7 F (36.5 C) 98.1 F (36.7 C)  TempSrc: Axillary Axillary Oral Oral  Resp: 18 20 20 17   Height:      Weight:      SpO2:   99%     Fundus firm Perineum without swelling.  Lab Results  Component Value Date   WBC 14.6* 06/09/2015   HGB 9.0* 06/09/2015   HCT 28.0* 06/09/2015   MCV 84.3 06/09/2015   PLT 218 06/09/2015    --/--/O POS, O POS (11/03 0200)/RI  A/P Post partum day 1 1/2.  Routine care.  Expect d/c routine.    Tanuj Mullens A

## 2015-06-21 ENCOUNTER — Ambulatory Visit (HOSPITAL_COMMUNITY)
Admission: RE | Admit: 2015-06-21 | Discharge: 2015-06-21 | Disposition: A | Discharge status: Home / Self Care | Payer: Commercial Managed Care - HMO | Source: Ambulatory Visit | Attending: Obstetrics and Gynecology | Admitting: Obstetrics and Gynecology

## 2015-06-21 NOTE — Lactation Note (Signed)
Lactation Consult  Mother's reason for visit:  Latching difficulty, some sore nipples, chomping at breast.  Mom states infant is sleepy at breast.   Visit Type:  Outpatient Lactation Consultation Appointment Notes:  [facility] visit yesterday 6 lbs, 8 oz; today's weight 6 lbs, 9 oz.  +1 weight gain over past 24 hrs appropriate weight gain.  Mom reports she has "successfully" breastfed 3x in past 24 hrs for 5-10 minutes and then bottle fed EBM after and the rest of the feedings throughout the day.  Minimal tongue restriction noted (see note below); suck blisters observed.  Chomping motion at breast throughout most of feeding that would not correct with curved-tip supplementation at breast with EBM.  Mom is not sure whether she wants to continue breastfeeding or switch to exclusive pumping and bottlefeeding EBM.  Information given and plan developed.   Consult:  Initial Lactation Consultant:  Merlene Laughter  ________________________________________________________________________ 30 Name: Sherry Shepherd Date of Birth: 06/08/2016 Pediatrician: Dr. Rosana Hoes [facility]) Gender: female Gestational Age: [redacted]w[redacted]d (At Birth) Birth Weight: 7 lb 0.5 oz (3190 g) Weight at Discharge: Weight: 6 lb 8.8 oz (2971 g)Date of Discharge: 06/10/2015 Filed Weights   06/08/15 2350 06/09/15 2300  Weight: 7 lb 0.5 oz (3190 g) 6 lb 8.8 oz (2971 g)        ________________________________________________________________________  Mother's Name: Sherry Shepherd Type of delivery:  Vaginal Breastfeeding Experience:  P1 Maternal Medical Conditions:  Gestational diabetes mellitis; infertility with husband (IUI) Maternal Medications:  PNV; Ibuprofen; Colace  ________________________________________________________________________  Breastfeeding History (Post Discharge) Frequency of breastfeeding:  Every 2-3 hrs during the day; cluster feeds at night. Duration of feeding:  5-10 minutes    Supplementation Breastmilk:  Volume 50 ml Frequency:  8+ times per day after BF or as sole feeding  Method:  Bottle  Pumping 3-5 times per day with Medela DEBP 100-140 ml total pumped with each pumping session  Infant Intake and Output Assessment Voids:  10-12 in 24 hrs.  Color:  Clear yellow Stools:  3-4 in 24 hrs.  Color:  Yellow seedy  ________________________________________________________________________  Maternal Breast Assessment Breast:  Full and Compressible Nipple:  Erect Pain level:  2 Pain interventions:  Lanolin  _______________________________________________________________________ Feeding Assessment/Evaluation  Initial feeding assessment: Infant's oral assessment:  Variance - Noted upper lip frenulum that extends close to gum line and does not allow for proper flanging of lips.  Suck blisters or cobblestoning noted on both upper and lower lip which is a sign infant is using lips to create seal since upper lip does not flange well.  Deep posterior sublingual frenulum only visible with manual lifting of tongue; blanches with lifting tongue.  Infant can stick tongue out past gum line and lateralized tongue well.  When sucking on gloved finger infant can suck in a wave-like motion without losing contact with finger, but also noted intermittent chomping on finger.   Positioning:  Cross cradle Left breast  LATCH documentation:  Latch:  1 = Repeated attempts needed to sustain latch, nipple held in mouth throughout feeding, stimulation needed to elicit sucking reflex.  Audible swallowing:  1 = A few with stimulation  Type of nipple:  2 = Everted at rest and after stimulation  Comfort (Breast/Nipple):  1 = Filling, red/small blisters or bruises, mild/mod discomfort -   Hold (Positioning):  1 = Assistance needed to correctly position infant at breast and maintain latch  LATCH score:  6-7  Attached assessment:  Shallow  Lips flanged:  No. Manual flanging and untucking  of lips needed throughout the feeding.    Lips untucked:  No.  Suck assessment:  Displays both  Tools:  Curved tip syringe with 7 ml EBM  Instructed on use and cleaning of tool:  Yes.    Pre-feed weight:  2978 g  (6 lb. 9 oz.) Post-feed weight:  3016 g  Amount transferred:  31 ml Amount supplemented:  7 ml with curved-tip syringe at breast with feeding Total intake at breast:  38 ml Post-Bottle supplemented with EBM:  20 ml Total intake Breast and EBM Bottle:  58 ml   Infant had a chomping motion at breast throughout most of feeding.  LC suggested giving EBM at breast with breastfeeding to try to correct chomping motion and get infant into a more nutritive sucking motion.  SNS &/or curved-tip syringe suggested.  Mom discussed how overwhelmed she has felt with breastfeeding and she knows her baby needs to be supplemented with extra based on behavior after feedings. Mom stated she is not sure if she wants to continue breastfeeding or switch to exclusive pumping. Based on mom's uncertainty and feeling overwhelmed, LC did not initiate an SNS but instead tried the curved-tip syringe at breast with 7 ml EBM.  Supplementing with curved-tip at breast did not improved chomping motion at breast.  Mom states that her nipple pain is minimal "2" with initial latching but increased when infant slips to tip or has a chomping motion.  Discussed potential origin of chomping as either tongue-restriction issue or nipple confusion issue based on number of bottles infant has been receiving.  Mom stated infant has never latched well even in hospital after birth.  Mom wanted extra information on tongue-restrictions and wanted to figure out if she really wants to continue with latching or switch to exclusive pumping and bottle feeding EBM.     Total amount transferred:  31 ml Total EBM supplement given:  27 ml  LC wrote plan based on information / goals mom gave: 1.  Try exclusive breastfeeding for 1-2 days with  giving supplementation at breast with curved-tip syringe. 2.  If exclusive breastfeeding is not an option, then breastfeed first for 15-30 minutes softening first breast well and then offer EBM via bottle. 3.  Supplement with 30+ ml with each breastfeeding (since infant can transfer ~30 ml at breast) for a total of 50-60 ml. intake 4.  If infant does not latch then bottle feed 55-75 ml (based on cues) for each non-latching feeding. 5.  Continue pumping at least 3-5 times per day (if latching infant several times throughout the day) or pump 8 times a day if not latching infant.  Discussed how to maintain milk supply with both Breastfeeding/Supplementation and/or Exclusive Pumping and Bottle. 6.  Information given to mom on tongue-restrictions and websites for mom to refer for more information so she can make a more informed decision.  Discuss with Pediatrician.   7.  Follow-up with [facility] (Tuesday, Nov. 22, 2016), Pediatrician (as scheduled), and Hospital support group for weight checks. 8.  Follow-up with Lactation if mom likes using the curved-tip syringe at breast and wants to try SNS at breast for goal of maintaining breastfeeding relationship.

## 2015-08-11 ENCOUNTER — Emergency Department (HOSPITAL_COMMUNITY)
Admission: EM | Admit: 2015-08-11 | Discharge: 2015-08-12 | Disposition: A | Discharge status: Home / Self Care | Payer: Commercial Managed Care - HMO | Attending: Emergency Medicine | Admitting: Emergency Medicine

## 2015-08-11 ENCOUNTER — Encounter (HOSPITAL_COMMUNITY): Payer: Self-pay | Admitting: Emergency Medicine

## 2015-08-11 ENCOUNTER — Emergency Department (HOSPITAL_COMMUNITY): Payer: Commercial Managed Care - HMO

## 2015-08-11 DIAGNOSIS — Y9389 Activity, other specified: Secondary | ICD-10-CM | POA: Insufficient documentation

## 2015-08-11 DIAGNOSIS — S53124A Posterior dislocation of right ulnohumeral joint, initial encounter: Secondary | ICD-10-CM | POA: Insufficient documentation

## 2015-08-11 DIAGNOSIS — S59901A Unspecified injury of right elbow, initial encounter: Secondary | ICD-10-CM | POA: Diagnosis present

## 2015-08-11 DIAGNOSIS — Y9289 Other specified places as the place of occurrence of the external cause: Secondary | ICD-10-CM | POA: Diagnosis not present

## 2015-08-11 DIAGNOSIS — S53104A Unspecified dislocation of right ulnohumeral joint, initial encounter: Secondary | ICD-10-CM

## 2015-08-11 DIAGNOSIS — W19XXXA Unspecified fall, initial encounter: Secondary | ICD-10-CM

## 2015-08-11 DIAGNOSIS — Z880 Allergy status to penicillin: Secondary | ICD-10-CM | POA: Insufficient documentation

## 2015-08-11 DIAGNOSIS — Y998 Other external cause status: Secondary | ICD-10-CM | POA: Diagnosis not present

## 2015-08-11 DIAGNOSIS — W000XXA Fall on same level due to ice and snow, initial encounter: Secondary | ICD-10-CM | POA: Insufficient documentation

## 2015-08-11 DIAGNOSIS — K219 Gastro-esophageal reflux disease without esophagitis: Secondary | ICD-10-CM | POA: Diagnosis not present

## 2015-08-11 DIAGNOSIS — Z79899 Other long term (current) drug therapy: Secondary | ICD-10-CM | POA: Insufficient documentation

## 2015-08-11 MED ORDER — HYDROCODONE-ACETAMINOPHEN 5-325 MG PO TABS: 1.0000 | ORAL_TABLET | ORAL | Status: DC | PRN

## 2015-08-11 MED ORDER — PROPOFOL 10 MG/ML IV BOLUS
1.0000 mg/kg | Freq: Once | INTRAVENOUS | Status: AC
  Administered 2015-08-11: 50 mg via INTRAVENOUS
  Filled 2015-08-11: qty 20

## 2015-08-11 MED ORDER — HYDROMORPHONE HCL 1 MG/ML IJ SOLN
1.0000 mg | Freq: Once | INTRAMUSCULAR | Status: AC
Start: 2015-08-11 — End: 2015-08-11
  Administered 2015-08-11: 1 mg via INTRAVENOUS
  Filled 2015-08-11: qty 1

## 2015-08-11 MED ORDER — HYDROMORPHONE HCL 1 MG/ML IJ SOLN
1.0000 mg | Freq: Once | INTRAMUSCULAR | Status: AC
  Administered 2015-08-11: 1 mg via INTRAVENOUS
  Filled 2015-08-11: qty 1

## 2015-08-11 MED ORDER — OXYCODONE HCL 5 MG PO TABS: 5.0000 mg | ORAL_TABLET | ORAL | Status: DC | PRN

## 2015-08-11 MED ORDER — ONDANSETRON HCL 4 MG/2ML IJ SOLN
4.0000 mg | Freq: Once | INTRAMUSCULAR | Status: AC
  Administered 2015-08-11: 4 mg via INTRAVENOUS
  Filled 2015-08-11: qty 2

## 2015-08-11 NOTE — ED Notes (Signed)
Pt is at bedside with her husband, breast pumping. Pt is a4ox4, and resting comfortably.

## 2015-08-11 NOTE — ED Provider Notes (Signed)
CSN: [id number]     Arrival date & time 08/11/15  1934 History   First MD Initiated Contact with Patient 08/11/15 1936     Chief Complaint  Patient presents with  . Fall  . Arm Injury     (Consider location/radiation/quality/duration/timing/severity/associated sxs/prior Treatment) Patient is a 30 y.o. female presenting with fall and arm injury. The history is provided by the patient.  Fall Pertinent negatives include no chest pain, no headaches and no shortness of breath.  Arm Injury Location:  Elbow Time since incident:  2 hours Injury: yes   Mechanism of injury: fall   Fall:    Fall occurred:  Standing   Impact surface:  [employer] of impact:  Outstretched arms Elbow location:  R elbow Pain details:    Quality:  Aching, sharp and shooting   Radiates to:  Does not radiate   Severity:  Severe   Onset quality:  Sudden   Duration:  2 hours   Timing:  Constant   Progression:  Worsening Chronicity:  New Handedness:  Right-handed Dislocation: yes   Relieved by:  Nothing Worsened by:  Movement, stress, stretching area and bearing weight Ineffective treatments:  None tried Associated symptoms: decreased range of motion, stiffness and swelling   Associated symptoms: no fever     30 yo F with a chief complaints of right elbow pain. Patient slipped on the ice and landed on an outstretched right hand. Pain and deformity to the right elbow. Happened just prior to arrival. Pain with movement palpation. States she had some tingling in her hand. Arrived via EMS difficulty controlling her pain.  Past Medical History  Diagnosis Date  . Allergy   . GERD (gastroesophageal reflux disease)   . Hx of varicella    Past Surgical History  Procedure Laterality Date  . Dental surgery     Family History  Problem Relation Age of Onset  . Diabetes Mother   . Diabetes Father   . Birth defects Brother     tongue tied   Social History  Substance Use Topics  . Smoking status: Never  Smoker   . Smokeless tobacco: Never Used  . Alcohol Use: No   OB History    Gravida Para Term Preterm AB TAB SAB Ectopic Multiple Living   1 1 1       0 1     Review of Systems  Constitutional: Negative for fever and chills.  HENT: Negative for congestion and rhinorrhea.   Eyes: Negative for redness and visual disturbance.  Respiratory: Negative for shortness of breath and wheezing.   Cardiovascular: Negative for chest pain and palpitations.  Gastrointestinal: Negative for nausea and vomiting.  Genitourinary: Negative for dysuria and urgency.  Musculoskeletal: Positive for myalgias, arthralgias and stiffness.  Skin: Negative for pallor and wound.  Neurological: Negative for dizziness and headaches.      Allergies  Amoxicillin and Cefprozil  Home Medications   Prior to Admission medications   Medication Sig Start Date End Date Taking? Authorizing Provider  Prenatal Vit-Fe Fumarate-FA (PRENATAL MULTIVITAMIN) TABS tablet Take 1 tablet by mouth daily at 12 noon.    Historical Provider, MD  ranitidine (ZANTAC) 150 MG tablet Take 150 mg by mouth 2 (two) times daily.    Historical Provider, MD   BP 107/66 mmHg  Pulse 85  Temp(Src) 98.2 F (36.8 C) (Oral)  Resp 20  Ht 5' (1.524 m)  Wt 160 lb (72.576 kg)  BMI 31.25 kg/m2  SpO2 97%  Physical Exam  Constitutional: She is oriented to person, place, and time. She appears well-developed and well-nourished. No distress.  HENT:  Head: Normocephalic and atraumatic.  Eyes: EOM are normal. Pupils are equal, round, and reactive to light.  Neck: Normal range of motion. Neck supple.  Cardiovascular: Normal rate and regular rhythm.  Exam reveals no gallop and no friction rub.   No murmur heard. Pulmonary/Chest: Effort normal. She has no wheezes. She has no rales.  Abdominal: Soft. She exhibits no distension. There is no tenderness. There is no rebound.  Musculoskeletal: She exhibits edema and tenderness.  Significant tenderness and  deformity to the right elbow.  Minimally diminished pulse to the hand. Motor and sensation intact.   Neurological: She is alert and oriented to person, place, and time.  Skin: Skin is warm and dry. She is not diaphoretic.  Psychiatric: She has a normal mood and affect. Her behavior is normal.  Nursing note and vitals reviewed.   ED Course  .Sedation Date/Time: 08/11/2015 10:38 PM Performed by: Tyrone Nine Acxel Dingee Authorized by: Deno Etienne  Consent:    Consent obtained:  Written   Consent given by:  Patient   Risks discussed:  Allergic reaction, dysrhythmia, inadequate sedation, nausea, vomiting, respiratory compromise necessitating ventilatory assistance and intubation, prolonged sedation necessitating reversal and prolonged hypoxia resulting in organ damage   Alternatives discussed:  Analgesia without sedation Indications:    Sedation purpose:  Dislocation reduction   Procedure necessitating sedation performed by:  Different physician   Intended level of sedation:  Moderate (conscious sedation) Pre-sedation assessment:    Time since last food or drink:  8 hours   ASA classification: class 1 - normal, healthy patient     Neck mobility: normal     Mouth opening:  3 or more finger widths   Thyromental distance:  4 finger widths   Mallampati score:  I - soft palate, uvula, fauces, pillars visible   Pre-sedation assessments completed and reviewed: airway patency, cardiovascular function, hydration status, mental status, nausea/vomiting, pain level, respiratory function and temperature   Immediate pre-procedure details:    Reassessment: Patient reassessed immediately prior to procedure     Reviewed: vital signs and relevant labs/tests     Verified: bag valve mask available, emergency equipment available, intubation equipment available, IV patency confirmed, oxygen available and suction available   Procedure details (see MAR for exact dosages):    Preoxygenation:  Nasal cannula   Sedation:   Propofol   Intra-procedure events: none   Post-procedure details:    Attendance: Constant attendance by certified staff until patient recovered     Recovery: Patient returned to pre-procedure baseline     Post-sedation assessments completed and reviewed: airway patency, cardiovascular function, hydration status, mental status, nausea/vomiting, pain level, respiratory function and temperature     Patient is stable for discharge or admission: Yes     Patient tolerance:  Tolerated well, no immediate complications Comments:     Propofol, 50mg  bolus, followed by 2 20mg  bolus.    (including critical care time) Labs Review Labs Reviewed - No data to display  Imaging Review Dg Elbow 2 Views Right  08/11/2015  CLINICAL DATA:  Pt fell and landed on R elbow; deformity; extreme pain;today EXAM: RIGHT ELBOW - 2 VIEW COMPARISON:  None. FINDINGS: There is a fracture dislocation of the right elbow. The coronoid process of the ulna has been fractured at its base and has displaced anteriorly and inferiorly along with the distal humerus. The ulna and radius  are dislocated posterior to the humerus. No other fractures. There is diffuse surrounding soft tissue swelling. IMPRESSION: Fracture dislocation of the right elbow. The ulna and radius are dislocated posteriorly. There is a fracture, displaced, of the coronoid process of the ulna. Electronically Signed   By: Lajean Manes M.D.   On: 08/11/2015 20:46   I have personally reviewed and evaluated these images and lab results as part of my medical decision-making.   EKG Interpretation None      MDM   Final diagnoses:  Fall    29 yo F with a chief complaint of right elbow pain. Patient has an obvious deformity to the right elbow likely due to dislocation. Initially with a diminished right radial pulse though improved with removal of the EMS splint. X-ray with fracture dislocation. Discussed with Dr. Grandville Silos, hand surgery. He performed the reduction under  conscious sedation. Patient with some continued subluxation post x-ray. He is able to manipulate it back in place. Will discharge her home.   I have discussed the diagnosis/risks/treatment options with the patient and family and believe the pt to be eligible for discharge home to follow-up with Hand surgery. We also discussed returning to the ED immediately if new or worsening sx occur. We discussed the sx which are most concerning (e.g., sudden worsening pain) that necessitate immediate return. Medications administered to the patient during their visit and any new prescriptions provided to the patient are listed below.  Medications given during this visit Medications  HYDROmorphone (DILAUDID) injection 1 mg (1 mg Intravenous Given 08/11/15 2010)  ondansetron (ZOFRAN) injection 4 mg (4 mg Intravenous Given 08/11/15 2010)  HYDROmorphone (DILAUDID) injection 1 mg (1 mg Intravenous Given 08/11/15 2059)  propofol (DIPRIVAN) 10 mg/mL bolus/IV push 72.6 mg (0 mg Intravenous Stopped 08/11/15 2131)    Discharge Medication List as of 08/11/2015 11:13 PM    START taking these medications   Details  HYDROcodone-acetaminophen (NORCO) 5-325 MG tablet Take 1-2 tablets by mouth every 4 (four) hours as needed., Starting 08/11/2015, Until Discontinued, Print    oxyCODONE (ROXICODONE) 5 MG immediate release tablet Take 1 tablet (5 mg total) by mouth every 4 (four) hours as needed for severe pain., Starting 08/11/2015, Until Discontinued, Print        The patient appears reasonably screen and/or stabilized for discharge and I doubt any other medical condition or other [facility] requiring further screening, evaluation, or treatment in the ED at this time prior to discharge.    Deno Etienne, DO 08/12/15 1359

## 2015-08-11 NOTE — ED Notes (Signed)
Pt to xray

## 2015-08-11 NOTE — Consult Note (Signed)
ORTHOPAEDIC CONSULTATION HISTORY & PHYSICAL REQUESTING PHYSICIAN: Sherry Etienne, DO  Chief Complaint: Right elbow injury  HPI: Sherry Shepherd is a 30 y.o. female who slipped and fell earlier today, sustaining an elbow injury on the right. X-rays obtained in emergency department revealing a fracture dislocation of the right elbow. She has pain, swelling, deformity.  Past Medical History  Diagnosis Date  . Allergy   . GERD (gastroesophageal reflux disease)   . Hx of varicella    Past Surgical History  Procedure Laterality Date  . Dental surgery     Social History   Social History  . Marital Status: Married    Spouse Name: N/A  . Number of Children: N/A  . Years of Education: N/A   Social History Main Topics  . Smoking status: Never Smoker   . Smokeless tobacco: Never Used  . Alcohol Use: No  . Drug Use: No  . Sexual Activity: Yes    Birth Control/ Protection: None   Other Topics Concern  . None   Social History Narrative   Family History  Problem Relation Age of Onset  . Diabetes Mother   . Diabetes Father   . Birth defects Brother     tongue tied   Allergies  Allergen Reactions  . Amoxicillin Hives  . Cefprozil Hives   Prior to Admission medications   Medication Sig Start Date End Date Taking? Authorizing Provider  HYDROcodone-acetaminophen (NORCO) 5-325 MG tablet Take 1-2 tablets by mouth every 4 (four) hours as needed. 08/11/15   Sherry Jakob, MD  Prenatal Vit-Fe Fumarate-FA (PRENATAL MULTIVITAMIN) TABS tablet Take 1 tablet by mouth daily at 12 noon.    Historical Provider, MD  ranitidine (ZANTAC) 150 MG tablet Take 150 mg by mouth 2 (two) times daily.    Historical Provider, MD   Dg Elbow 2 Views Right  08/11/2015  CLINICAL DATA:  Postreduction. EXAM: RIGHT ELBOW - 2 VIEW COMPARISON:  RIGHT elbow radiographs August 11, 2015 at 2028 hours FINDINGS: Improved alignment of elbow fracture dislocation, with persistent residual posterior subluxation of the proximal  radius and ulna. Persistent displaced fracture of the coronoid, fragment distally displaced. Presence of thick fiberglass cast obscures the fine bony details. IMPRESSION: Improved alignment of fracture dislocation with residual subluxation. Displaced coronoid fracture. Electronically Signed   By: Sherry Shepherd M.D.   On: 08/11/2015 21:49   Dg Elbow 2 Views Right  08/11/2015  CLINICAL DATA:  Pt fell and landed on R elbow; deformity; extreme pain;today EXAM: RIGHT ELBOW - 2 VIEW COMPARISON:  None. FINDINGS: There is a fracture dislocation of the right elbow. The coronoid process of the ulna has been fractured at its base and has displaced anteriorly and inferiorly along with the distal humerus. The ulna and radius are dislocated posterior to the humerus. No other fractures. There is diffuse surrounding soft tissue swelling. IMPRESSION: Fracture dislocation of the right elbow. The ulna and radius are dislocated posteriorly. There is a fracture, displaced, of the coronoid process of the ulna. Electronically Signed   By: Sherry Shepherd M.D.   On: 08/11/2015 20:46    Positive ROS: All other systems have been reviewed and were otherwise negative with the exception of those mentioned in the HPI and as above.  Physical Exam: Vitals: Refer to EMR. Constitutional:  WD, WN, NAD HEENT:  NCAT, EOMI Neuro/Psych:  Alert & oriented to person, place, and time; appropriate mood & affect Lymphatic: No generalized extremity edema or lymphadenopathy Extremities / MSK:  The extremities are  normal with respect to appearance, ranges of motion, joint stability, muscle strength/tone, sensation, & perfusion except as otherwise noted:  Right elbow held flexed approximately 90. Pain with any movement of the elbow. Intact light touch sensibility in the radial, median, ulnar nerve distribution, intact motor to the same including AIN and PIN. Radial pulse palpable with warm fingers and brisk capillary refill.  Assessment: Right  elbow fracture dislocation with type 1 coronoid fracture  Plan: Dr. Tyrone Shepherd provided conscious sedation with propofol, and I performed a gentle reduction of the right elbow. Long-arm splint was applied. Postreduction x-rays were poorly positioned, but suggested a possible degree of residual subluxation. As I went in to speak with the patient, and was moving her arm at the shoulder, she had a spasm of pain and indicated that she thought the elevated become redislocated. Her splint was removed, and I examined her elbow under fluoroscopy. It was concentrically reduced at 90, and this was maintained until at least 45 of full extension.  A new long-arm splint was applied, and repeat fluoroscopic evaluation confirmed an acceptably congruent reduction. She was placed in a sling. She will be discharged with instructions for analgesia, prescription for Norco, appropriate instructions to include that for compartment syndrome, which should prompt urgent reevaluation. Our office will call her Monday or Tuesday to arrange follow-up for the early part of the following week.  Radiographs: AP of the distal humerus, AP of the proximal forearm, and lateral of the right elbow reveals a concentrically reduced ulnohumeral joint, with near anatomically reduced small coronoid fracture fragment.  Sherry Shepherd, [address], [place]  [address] Office: [phone] Mobile: [phone]

## 2015-08-11 NOTE — Progress Notes (Signed)
Orthopedic Tech Progress Note Patient Details:  Sherry Shepherd 24-Sep-1985 [id number]  Ortho Devices Type of Ortho Device: Ace wrap, Arm sling, Post (long arm) splint Ortho Device/Splint Location: RUE Ortho Device/Splint Interventions: Ordered, Application   Braulio Bosch 08/11/2015, 10:03 PM

## 2015-08-11 NOTE — Sedation Documentation (Signed)
20mg  propofol given per Tyrone Nine, MD

## 2015-08-11 NOTE — Sedation Documentation (Signed)
20mg  more of propofol given per Tyrone Nine, MD.

## 2015-08-11 NOTE — Discharge Instructions (Signed)
Elbow Dislocation  Elbow dislocation is the displacement of the bones that form the elbow joint. Three bones come together to form the elbow. The humerus is the bone in the upper arm. The radius and ulna are the 2 bones in the forearm that form the lower part of the elbow. The elbow is held in place by very strong, fibrous tissues (ligaments) that connect the bones to each other.  CAUSES  Elbow dislocations are not common. Typically, they occur when a person falls forward with hands and elbows outstretched. The force of the impact is sent to the elbow. Usually, there is a twisting motion in this force. Elbow dislocations also happen during car crashes when passengers reach out to brace themselves during the impact.  RISK FACTORS  Although dislocation of the elbow can happen to anyone, some people are at greater risk than others. People at increased risk of elbow dislocation include:  · People born with greater looseness in their ligaments.  · People born with an ulna bone that has a shallow groove for the elbow hinge joint.  SYMPTOMS  Symptoms of a complete elbow dislocation usually are obvious. They include extreme pain and the appearance of a deformed arm.   Symptoms of a partial dislocation may not be obvious. Your elbow may move somewhat, but you may have pain and swelling. Also, there will likely be bruising on the inside and outside of your elbow where ligaments have been stretched or torn.   DIAGNOSIS   To diagnose elbow dislocation, your caregiver will perform a physical exam. During this exam, your caregiver will check your arm for tenderness, swelling, and deformity. The skin around your arm and the circulation in your arm also will be checked. Your pulse will be checked at your wrist. If your artery is injured during dislocation, your hand will be cool to the touch and may be white or purple in color. Your caregiver also may check your arm and your ability to move your wrist and fingers to see if you had  any damage to your nerves during dislocation.  An X-ray exam also may be done to determine if there is bone injury. Results of an X-ray exam can help show the direction of the dislocation.  If you have a simple dislocation, there is no major bone injury. If you have a complex dislocation, you may have broken bones (fractures) associated with the ligament injuries.  TREATMENT  For a simple elbow dislocation, your bones can usually be realigned in a procedure called a reduction. This is a treatment in which your bones are manually moved back into place either with the use of numbing medicine (regional anesthetic) around your elbow or medicine to make you sleep (general anesthetic). Then your elbow is kept immobile with a sling or a splint for 2 to 3 weeks. This is followed with physical therapy to help your joint move again.  Complex elbow dislocation may require surgery to restore joint alignment and repair ligaments. After surgery, your elbow may be protected with an external hinge. This device keeps your elbow from dislocating again while motion exercises are done. Additional surgery may be needed to repair any injuries to blood vessels and nerves or bones and ligaments or to relieve pressure from excessive swelling around the muscles.  HOME CARE INSTRUCTIONS  The following measures can help to reduce pain and hasten the healing process:  · Rest your injured joint. Do not move it. Avoid activities similar to the one that caused   for 1 to 2 days after your reduction or as directed by your caregiver. Applying ice helps to reduce inflammation and pain.  Put ice in a plastic bag.  Place a towel between your skin and the bag.  Leave the ice on for 15 to 20 minutes at a time, every couple of hours while you are awake.  Elevate your arm above your heart and move your wrist and fingers as instructed by  your caregiver to help limit swelling.  Take over-the-counter or prescription medicines for pain as directed by your caregiver. SEEK IMMEDIATE MEDICAL CARE IF:  Your splint becomes damaged.  You have an external hinge and it becomes loose or will not move.  You have an external hinge and you develop drainage around the pins.  Your pain becomes worse rather than better.  You lose feeling in your hand or fingers. MAKE SURE YOU:  Understand these instructions.  Will watch your condition.  Will get help right away if you are not doing well or get worse.   This information is not intended to replace advice given to you by your health care provider. Make sure you discuss any questions you have with your health care provider.   Document Released: 07/15/2001 Document Revised: 08/11/2014 Document Reviewed: 03/05/2015 Elsevier Interactive Patient Education 2016 [place] or Splint Care Casts and splints support injured limbs and keep bones from moving while they heal. It is important to care for your cast or splint at home.  HOME CARE INSTRUCTIONS  Keep the cast or splint uncovered during the drying period. It can take 24 to 48 hours to dry if it is made of plaster. A fiberglass cast will dry in less than 1 hour.  Do not rest the cast on anything harder than a pillow for the first 24 hours.  Do not put weight on your injured limb or apply pressure to the cast until your health care provider gives you permission.  Keep the cast or splint dry. Wet casts or splints can lose their shape and may not support the limb as well. A wet cast that has lost its shape can also create harmful pressure on your skin when it dries. Also, wet skin can become infected.  Cover the cast or splint with a plastic bag when bathing or when out in the rain or snow. If the cast is on the trunk of the body, take sponge baths until the cast is removed.  If your cast does become wet, dry it with a  towel or a blow dryer on the cool setting only.  Keep your cast or splint clean. Soiled casts may be wiped with a moistened cloth.  Do not place any hard or soft foreign objects under your cast or splint, such as cotton, toilet paper, lotion, or powder.  Do not try to scratch the skin under the cast with any object. The object could get stuck inside the cast. Also, scratching could lead to an infection. If itching is a problem, use a blow dryer on a cool setting to relieve discomfort.  Do not trim or cut your cast or remove padding from inside of it.  Exercise all joints next to the injury that are not immobilized by the cast or splint. For example, if you have a long leg cast, exercise the hip joint and toes. If you have an arm cast or splint, exercise the shoulder, elbow, thumb, and fingers.  Elevate your injured arm or leg on  1 or 2 pillows for the first 1 to 3 days to decrease swelling and pain.It is best if you can comfortably elevate your cast so it is higher than your heart. SEEK MEDICAL CARE IF:   Your cast or splint cracks.  Your cast or splint is too tight or too loose.  You have unbearable itching inside the cast.  Your cast becomes wet or develops a soft spot or area.  You have a bad smell coming from inside your cast.  You get an object stuck under your cast.  Your skin around the cast becomes red or raw.  You have new pain or worsening pain after the cast has been applied. SEEK IMMEDIATE MEDICAL CARE IF:   You have fluid leaking through the cast.  You are unable to move your fingers or toes.  You have discolored (blue or white), cool, painful, or very swollen fingers or toes beyond the cast.  You have tingling or numbness around the injured area.  You have severe pain or pressure under the cast.  You have any difficulty with your breathing or have shortness of breath.  You have chest pain.   This information is not intended to replace advice given to you  by your health care provider. Make sure you discuss any questions you have with your health care provider.   Document Released: 07/18/2000 Document Revised: 05/11/2013 Document Reviewed: 01/27/2013 Elsevier Interactive Patient Education 2016 [employer]  Acute Compartment Syndrome Compartment syndrome is a painful condition that occurs when swelling and pressure build up in a body space (compartment) of the arms or legs. Groups of muscles, nerves, and blood vessels in the arms and legs are separated into various compartments. Each compartment is surrounded by tough layers of tissue called fascia. In compartment syndrome, pressure builds up within the layers of fascia and begins to push on the structures within that compartment.  In acute compartment syndrome, the pressure builds up suddenly, often as the result of an injury. This is a surgical emergency. When a muscle in the compartment moves, you may feel severe pain. If pressure continues to increase, it can block the flow of blood in the smallest blood vessels (capillaries). Then, the nerves and muscles in the compartment cannot get enough oxygen and nutrients (substances needed for survival). They will start to die within 4-8 hours. That is why the pressure needs to be relieved immediately. Identifying the condition early and treating it quickly can prevent most problems. CAUSES  Various things can lead to compartment syndrome. Possible causes include:   Injury. Some injuries can cause swelling or bleeding in a compartment. This can lead to compartment syndrome. Injuries that may cause this problem include:  Broken bones, especially the long bones of the arms and legs.  Crushing injuries.  Penetrating injuries, such as a knife wound that punctures the skin and tissue underneath.  Badly bruised muscles.  Poisonous bites, such as a snake bite.  Severe burns.  Blocked blood flow. This could result from:  A cast or bandage that is too  tight.  A surgical procedure. Blood flow sometimes has to be stopped for a while during a surgery, usually with a tourniquet.  Lying for too long in a position that restricts blood flow. This can happen in people who have nerve damage or if a person is unconscious for a long time.  Drugs used to build up muscles (anabolic steroids).  Drugs that keep the blood from forming clots (blood thinners). SIGNS AND  SYMPTOMS  The most common symptom of compartment syndrome is pain. The pain may:   Get worse when moving or stretching the affected body part.  Be more severe than it should be for an injury.  Come along with a feeling of tingling or burning.  Become worse when the area is pushed or squeezed.  Be unaffected by pain medicine. Other symptoms include:   A feeling of tightness or fullness in the affected area.   A loss of feeling.  Weakness in the area.  Loss of movement.  Skin becoming pale, tight, and shiny over the painful area.  DIAGNOSIS  Your health care provider may suspect the problem based on how you describe the pain. The diagnosis is made by using a special device that measures the pressure in the affected area. Blood tests, X-rays, or an ultrasound exam may be done to help rule out other problems.  TREATMENT  Compartment syndrome is a surgical emergency. It should be treated very quickly.   First-aid treatment is given first. This may include:  Promptly treating an injury.  Loosening or removing any cast, bandage, or external wrap that may be causing pain.  Raising the painful arm or leg to the same level as the heart.  Giving oxygen.  Giving fluid through an IV access tube that is put into a vein in the hand or arm.  Surgery (fasciotomy) is needed to relieve the pressure and help prevent permanent damage. In this surgery, cuts (incisions) are made through the fascia to relieve the pressure in the compartment.   This information is not intended to replace  advice given to you by your health care provider. Make sure you discuss any questions you have with your health care provider.   Document Released: 07/09/2009 Document Revised: 03/23/2013 Document Reviewed: 02/22/2013 Elsevier Interactive Patient Education [employer].

## 2015-08-11 NOTE — ED Notes (Signed)
Per EMS, pt was walking with her husband when she slipped and fell on ice. Pt with obvious deformity to right elbow. Pt has received 236mcg fentanyl per EMS. No other complaints.

## 2015-12-18 ENCOUNTER — Encounter: Payer: Self-pay | Admitting: Family Medicine

## 2015-12-18 ENCOUNTER — Ambulatory Visit (INDEPENDENT_AMBULATORY_CARE_PROVIDER_SITE_OTHER): Payer: Commercial Managed Care - HMO | Admitting: Family Medicine

## 2015-12-18 VITALS — BP 118/82 | HR 99 | Temp 98.1°F | Wt 151.0 lb

## 2015-12-18 DIAGNOSIS — J029 Acute pharyngitis, unspecified: Secondary | ICD-10-CM | POA: Insufficient documentation

## 2015-12-18 LAB — POCT RAPID STREP A (OFFICE): Rapid Strep A Screen: NEGATIVE

## 2015-12-18 MED ORDER — AZITHROMYCIN 250 MG PO TABS: ORAL_TABLET | ORAL | Status: DC

## 2015-12-18 NOTE — Patient Instructions (Signed)

## 2015-12-18 NOTE — Progress Notes (Signed)
   Subjective:   Sherry Shepherd is a 30 y.o. female with a history of subclinical hyperthyroidism and postpartum/breastfeeding here for sore throat  SORE THROAT  Sore throat began 3 days ago. 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     Objective:  BP 118/82 mmHg  Pulse 99  Temp(Src) 98.1 F (36.7 C) (Oral)  Wt 151 lb (68.493 kg)  Gen:  30 y.o. female in NAD HEENT: NCAT, MMM, EOMI, PERRL, anicteric sclerae, oropharyngeal erythema and edema without exudate CV: RRR, no MRG Resp: Non-labored, CTAB, no wheezes noted Abd: Soft, NTND, BS present, no guarding or organomegaly Ext: WWP, no edema Neuro: Alert and oriented, speech normal    Assessment & Plan:     Sherry Shepherd is a 30 y.o. 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

## 2015-12-18 NOTE — Assessment & Plan Note (Signed)
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

## 2015-12-19 LAB — STREP A DNA PROBE: GASP: NOT DETECTED

## 2016-05-13 ENCOUNTER — Ambulatory Visit (INDEPENDENT_AMBULATORY_CARE_PROVIDER_SITE_OTHER): Payer: Managed Care, Other (non HMO) | Admitting: Student

## 2016-05-13 VITALS — BP 129/81 | HR 116 | Temp 97.8°F | Wt 156.0 lb

## 2016-05-13 DIAGNOSIS — R51 Headache: Secondary | ICD-10-CM

## 2016-05-13 DIAGNOSIS — G44209 Tension-type headache, unspecified, not intractable: Secondary | ICD-10-CM | POA: Diagnosis not present

## 2016-05-13 DIAGNOSIS — G44201 Tension-type headache, unspecified, intractable: Secondary | ICD-10-CM

## 2016-05-13 DIAGNOSIS — R519 Headache, unspecified: Secondary | ICD-10-CM | POA: Insufficient documentation

## 2016-05-13 MED ORDER — IBUPROFEN 600 MG PO TABS: 600.0000 mg | ORAL_TABLET | Freq: Three times a day (TID) | ORAL | 0 refills | Status: DC | PRN

## 2016-05-13 NOTE — Assessment & Plan Note (Addendum)
Likely tension headache. I have low suspicion for infection or malignancy at this time. Discussed supportive therapy including relaxation, adequate hydration and exercise. Also gave prescription for ibuprofen 600 mg up to 3 times a day when necessary headache. Advised her to take this medication with food. Discussed return precautions. If no improvement, I will at least consider TSH, prolactin levels +/-MRI head due to her tachycardia and change in her menstruation.

## 2016-05-13 NOTE — Patient Instructions (Addendum)
It was great seeing you today! Your headache is likely tension headache (see below for more on this). I have sent a prescription for ibuprofen to the pharmacy. You can take this up to 3 times a day. Please make sure you take this medication with food so that it won't upset your stomach. If you have new symptoms such as vision changes, slurred speech, weakness, numbness, tingling or other symptoms concerning to you,  please come back and see [place]   If we did any lab work today, and the results require attention, either me or my nurse will get in touch with you. If everything is normal, you will get a letter in mail. If you don't hear from [place] in two weeks, please give [place] a call. Otherwise, I look forward to talking with you again at our next visit. If you have any questions or concerns before then, please call the clinic at [phone].  Please bring all your medications to every doctors visit   Sign up for My Chart to have easy access to your labs results, and communication with your Primary care physician.    Please check-out at the front desk before leaving the clinic.   Take Care,     Tension Headache A tension headache is a feeling of pain, pressure, or aching that is often felt over the front and sides of the head. The pain can be dull, or it can feel tight (constricting). Tension headaches are not normally associated with nausea or vomiting, and they do not get worse with physical activity. Tension headaches can last from 30 minutes to several days. This is the most common type of headache. CAUSES The exact cause of this condition is not known. Tension headaches often begin after stress, anxiety, or depression. Other triggers may include:  Alcohol.  Too much caffeine, or caffeine withdrawal.  Respiratory infections, such as colds, flu, or sinus infections.  Dental problems or teeth clenching.  Fatigue.  Holding your head and neck in the same position for a long period of time,  such as while using a computer.  Smoking. SYMPTOMS Symptoms of this condition include:  A feeling of pressure around the head.  Dull, aching head pain.  Pain felt over the front and sides of the head.  Tenderness in the muscles of the head, neck, and shoulders. DIAGNOSIS This condition may be diagnosed based on your symptoms and a physical exam. Tests may be done, such as a CT scan or an MRI of your head. These tests may be done if your symptoms are severe or unusual. TREATMENT This condition may be treated with lifestyle changes and medicines to help relieve symptoms. HOME CARE INSTRUCTIONS Managing Pain  Take over-the-counter and prescription medicines only as told by your health care provider.  Lie down in a dark, quiet room when you have a headache.  If directed, apply ice to the head and neck area:  Put ice in a plastic bag.  Place a towel between your skin and the bag.  Leave the ice on for 20 minutes, 2-3 times per day.  Use a heating pad or a hot shower to apply heat to the head and neck area as told by your health care provider. Eating and Drinking  Eat meals on a regular schedule.  Limit alcohol use.  Decrease your caffeine intake, or stop using caffeine. General Instructions  Keep all follow-up visits as told by your health care provider. This is important.  Keep a headache journal  to help find out what may trigger your headaches. For example, write down:  What you eat and drink.  How much sleep you get.  Any change to your diet or medicines.  Try massage or other relaxation techniques.  Limit stress.  Sit up straight, and avoid tensing your muscles.  Do not use tobacco products, including cigarettes, chewing tobacco, or e-cigarettes. If you need help quitting, ask your health care provider.  Exercise regularly as told by your health care provider.  Get 7-9 hours of sleep, or the amount recommended by your health care provider. SEEK MEDICAL  CARE IF:  Your symptoms are not helped by medicine.  You have a headache that is different from what you normally experience.  You have nausea or you vomit.  You have a fever. SEEK IMMEDIATE MEDICAL CARE IF:  Your headache becomes severe.  You have repeated vomiting.  You have a stiff neck.  You have a loss of vision.  You have problems with speech.  You have pain in your eye or ear.  You have muscular weakness or loss of muscle control.  You lose your balance or you have trouble walking.  You feel faint or you pass out.  You have confusion.   This information is not intended to replace advice given to you by your health care provider. Make sure you discuss any questions you have with your health care provider.   Document Released: 07/21/2005 Document Revised: 04/11/2015 Document Reviewed: 11/13/2014 Elsevier Interactive Patient Education [employer].

## 2016-05-13 NOTE — Progress Notes (Signed)
   Subjective:    Patient ID: Sherry Shepherd is a 30 y.o. old female.  HPI #Headache for 6 days. Feels like pressure all over. No cold like symptoms. Headache is getting worse. Tried tylenol and aleve which helped for two hours. She took 200 mg every 12-24 hours. Couldn't play with her 42 month old daughter. No fever or chills. Headache more intense in the evening. Wakes her up at night. No new medicine. Period is lighter which is odd for her. Felt lightheaded when she got today. She has been eating and drinking light stuff. She also has nausea. Denies emesis.  Never had a headache like this before. Denies neurologic symptoms. Denies stress at home or work. Denies drinking caffeine.   PMH: reviewed  SH: Denies smoking, alcohol or recreational drug use.   Review of Systems Per HPI Objective:   Vitals:   05/13/16 1020  BP: 129/81  Pulse: (!) 116  Temp: 97.8 F (36.6 C)  TempSrc: Oral  Weight: 156 lb (70.8 kg)   GEN: appears well, no apparent distress. CVS: RRR, normal s1 and s2, no murmurs, no edema RESP: no increased work of breathing MSK: No tenderness to palpation over temporal bones bilaterally ENDO: No thyromegaly NEURO: alert and oriented appropriately, no gross defecits  PSYCH: appropriate mood and affect     Assessment & Plan:  Headache Likely tension headache. I have low suspicion for infection or malignancy at this time. Discussed supportive therapy including relaxation, adequate hydration and exercise. Also gave prescription for ibuprofen 600 mg up to 3 times a day when necessary headache. Advised her to take this medication with food. Discussed return precautions. If no improvement, I will at least consider TSH, prolactin levels +/-MRI head due to her tachycardia and change in her menstruation.

## 2016-05-14 ENCOUNTER — Emergency Department (HOSPITAL_COMMUNITY)
Admission: EM | Admit: 2016-05-14 | Discharge: 2016-05-14 | Disposition: A | Discharge status: Home / Self Care | Payer: Managed Care, Other (non HMO) | Attending: Emergency Medicine | Admitting: Emergency Medicine

## 2016-05-14 ENCOUNTER — Encounter (HOSPITAL_COMMUNITY): Payer: Self-pay

## 2016-05-14 ENCOUNTER — Emergency Department (HOSPITAL_COMMUNITY): Payer: Managed Care, Other (non HMO)

## 2016-05-14 DIAGNOSIS — G43809 Other migraine, not intractable, without status migrainosus: Secondary | ICD-10-CM | POA: Diagnosis not present

## 2016-05-14 DIAGNOSIS — R51 Headache: Secondary | ICD-10-CM | POA: Diagnosis present

## 2016-05-14 LAB — PREGNANCY, URINE: PREG TEST UR: NEGATIVE

## 2016-05-14 MED ORDER — ONDANSETRON HCL 4 MG/2ML IJ SOLN
4.0000 mg | Freq: Once | INTRAMUSCULAR | Status: AC
  Administered 2016-05-14: 4 mg via INTRAVENOUS
  Filled 2016-05-14: qty 2

## 2016-05-14 MED ORDER — METOCLOPRAMIDE HCL 5 MG/ML IJ SOLN
10.0000 mg | Freq: Once | INTRAMUSCULAR | Status: AC
  Administered 2016-05-14: 10 mg via INTRAVENOUS
  Filled 2016-05-14: qty 2

## 2016-05-14 MED ORDER — KETOROLAC TROMETHAMINE 30 MG/ML IJ SOLN
30.0000 mg | Freq: Once | INTRAMUSCULAR | Status: AC
  Administered 2016-05-14: 30 mg via INTRAVENOUS
  Filled 2016-05-14: qty 1

## 2016-05-14 MED ORDER — DEXAMETHASONE SODIUM PHOSPHATE 10 MG/ML IJ SOLN
10.0000 mg | Freq: Once | INTRAMUSCULAR | Status: AC
  Administered 2016-05-14: 10 mg via INTRAVENOUS
  Filled 2016-05-14: qty 1

## 2016-05-14 MED ORDER — OXYCODONE-ACETAMINOPHEN 5-325 MG PO TABS: 2.0000 | ORAL_TABLET | ORAL | 0 refills | Status: DC | PRN

## 2016-05-14 MED ORDER — VALPROATE SODIUM 500 MG/5ML IV SOLN
500.0000 mg | Freq: Once | INTRAVENOUS | Status: AC
  Administered 2016-05-14: 500 mg via INTRAVENOUS
  Filled 2016-05-14: qty 5

## 2016-05-14 MED ORDER — ONDANSETRON 4 MG PO TBDP
4.0000 mg | ORAL_TABLET | Freq: Once | ORAL | Status: AC
  Administered 2016-05-14: 4 mg via ORAL
  Filled 2016-05-14: qty 1

## 2016-05-14 MED ORDER — ONDANSETRON 4 MG PO TBDP: 4.0000 mg | ORAL_TABLET | Freq: Once | ORAL | Status: DC

## 2016-05-14 MED ORDER — DIPHENHYDRAMINE HCL 50 MG/ML IJ SOLN
25.0000 mg | Freq: Once | INTRAMUSCULAR | Status: AC
  Administered 2016-05-14: 25 mg via INTRAVENOUS
  Filled 2016-05-14: qty 1

## 2016-05-14 MED ORDER — ONDANSETRON 4 MG PO TBDP: 4.0000 mg | ORAL_TABLET | Freq: Three times a day (TID) | ORAL | 0 refills | Status: DC | PRN

## 2016-05-14 MED ORDER — SODIUM CHLORIDE 0.9 % IV BOLUS (SEPSIS)
1000.0000 mL | Freq: Once | INTRAVENOUS | Status: AC
  Administered 2016-05-14: 1000 mL via INTRAVENOUS

## 2016-05-14 MED ORDER — MORPHINE SULFATE (PF) 4 MG/ML IV SOLN
4.0000 mg | INTRAVENOUS | Status: DC | PRN
  Administered 2016-05-14: 4 mg via INTRAVENOUS
  Filled 2016-05-14: qty 1

## 2016-05-14 NOTE — ED Provider Notes (Signed)
[facility] DEPT Provider Note   CSN: [id number] Arrival date & time: 05/14/16  [id number]     History   Chief Complaint Chief Complaint  Patient presents with  . Headache    HPI Sherry Shepherd is a 30 y.o. female. She presents with a headache for the last 6 days. She states she is not someone gets headaches on a regular basis. His never been diagnosed with a migraine. No fall no injury no trauma. No fevers or chills. She describes symptoms starting last Thursday, today is Wednesday. States that it is bifrontal and throbbing in nature. She has mild associated nausea and is photophobic. No fever or neck pain. No past similar episodes. She was 10 days late on her menstrual cycle, but has had menses for the last 5 days. HPI  Past Medical History:  Diagnosis Date  . Allergy   . GERD (gastroesophageal reflux disease)   . Hx of varicella     Patient Active Problem List   Diagnosis Date Noted  . Headache 05/13/2016  . Acute pharyngitis 12/18/2015  . Postpartum state 06/09/2015  . Pregnancy 06/07/2015  . Overweight(278.02) 10/07/2011  . Fatigue 10/07/2011  . HPV vaccine counseling 10/07/2011  . TENDINITIS 05/30/2009  . HYPERTHYROIDISM, SUBCLINICAL 03/23/2009    Past Surgical History:  Procedure Laterality Date  . DENTAL SURGERY      OB History    Gravida Para Term Preterm AB Living   1 1 1     1    SAB TAB Ectopic Multiple Live Births         0 1       Home Medications    Prior to Admission medications   Medication Sig Start Date End Date Taking? Authorizing Provider  acetaminophen (TYLENOL) 500 MG tablet Take 1,000 mg by mouth every 6 (six) hours as needed for headache.   Yes Historical Provider, MD  ibuprofen (ADVIL,MOTRIN) 200 MG tablet Take 800 mg by mouth every 6 (six) hours as needed for headache.   Yes Historical Provider, MD  azithromycin (ZITHROMAX) 250 MG tablet Take 2 tabs today and then 1 tablet daily for the next 4 days. Patient not taking: Reported on  05/14/2016 12/18/15   Frazier Richards, MD  ibuprofen (ADVIL,MOTRIN) 600 MG tablet Take 1 tablet (600 mg total) by mouth every 8 (eight) hours as needed. Patient not taking: Reported on 05/14/2016 05/13/16   Mercy Riding, MD  ondansetron (ZOFRAN ODT) 4 MG disintegrating tablet Take 1 tablet (4 mg total) by mouth every 8 (eight) hours as needed for nausea. 05/14/16   Tanna Furry, MD  oxyCODONE-acetaminophen (PERCOCET/ROXICET) 5-325 MG tablet Take 2 tablets by mouth every 4 (four) hours as needed. 05/14/16   Tanna Furry, MD    Family History Family History  Problem Relation Age of Onset  . Diabetes Mother   . Diabetes Father   . Birth defects Brother     tongue tied    Social History Social History  Substance Use Topics  . Smoking status: Never Smoker  . Smokeless tobacco: Never Used  . Alcohol use No     Allergies   Amoxicillin and Cefprozil   Review of Systems Review of Systems  Constitutional: Negative for appetite change, chills, diaphoresis, fatigue and fever.  HENT: Negative for mouth sores, sore throat and trouble swallowing.   Eyes: Negative for visual disturbance.  Respiratory: Negative for cough, chest tightness, shortness of breath and wheezing.   Cardiovascular: Negative for chest pain.  Gastrointestinal: Positive for  nausea. Negative for abdominal distention, abdominal pain, diarrhea and vomiting.  Endocrine: Negative for polydipsia, polyphagia and polyuria.  Genitourinary: Negative for dysuria, frequency and hematuria.  Musculoskeletal: Negative for gait problem.  Skin: Negative for color change, pallor and rash.  Neurological: Positive for headaches. Negative for dizziness, syncope and light-headedness.  Hematological: Does not bruise/bleed easily.  Psychiatric/Behavioral: Negative for behavioral problems and confusion.     Physical Exam Updated Vital Signs BP 112/76   Pulse 95   Temp 97.9 F (36.6 C) (Oral)   Resp 18   Ht 5' (1.524 m)   Wt 156 lb (70.8  kg)   LMP 05/13/2016   SpO2 98%   BMI 30.47 kg/m   Physical Exam  Constitutional: She is oriented to person, place, and time. She appears well-developed and well-nourished. No distress.  HENT:  Head: Normocephalic.  Eyes: Conjunctivae are normal. Pupils are equal, round, and reactive to light. No scleral icterus.  Neck: Normal range of motion. Neck supple. No thyromegaly present.  Cardiovascular: Normal rate and regular rhythm.  Exam reveals no gallop and no friction rub.   No murmur heard. Pulmonary/Chest: Effort normal and breath sounds normal. No respiratory distress. She has no wheezes. She has no rales.  Abdominal: Soft. Bowel sounds are normal. She exhibits no distension. There is no tenderness. There is no rebound.  Musculoskeletal: Normal range of motion.  Neurological: She is alert and oriented to person, place, and time.  Intact cranial nerves. Intact peripheral neurological exam. Normal gait. Supple neck. Afebrile.  Skin: Skin is warm and dry. No rash noted.  Psychiatric: She has a normal mood and affect. Her behavior is normal.     ED Treatments / Results  Labs (all labs ordered are listed, but only abnormal results are displayed) Labs Reviewed  PREGNANCY, URINE    EKG  EKG Interpretation None       Radiology Ct Head Wo Contrast  Result Date: 05/14/2016 CLINICAL DATA:  Headache for 6 days.  Photophobia and dizziness EXAM: CT HEAD WITHOUT CONTRAST TECHNIQUE: Contiguous axial images were obtained from the base of the skull through the vertex without intravenous contrast. COMPARISON:  None. FINDINGS: Brain: The ventricles are normal in size and configuration. There is no intracranial mass, hemorrhage, extra-axial fluid collection, or midline shift. Gray-white compartments are normal. No acute infarct evident. Vascular: There is no demonstrable hyperdense vessel. No vascular calcifications are evident. Skull: The bony calvarium appears intact. Sinuses/Orbits:  Visualized paranasal sinuses are clear. Incidental note is made of leftward deviation of the nasal septum. No intraorbital lesions are evident. Other: Mastoid air cells are clear. There is debris in each external auditory canal. IMPRESSION: No intracranial mass, hemorrhage, or extra-axial fluid collection. Gray-white compartments are normal. Leftward deviation of the nasal septum. Probable cerumen in each external auditory canal. Electronically Signed   By: Lowella Grip III M.D.   On: 05/14/2016 08:34    Procedures Procedures (including critical care time)  Medications Ordered in ED Medications  metoCLOPramide (REGLAN) injection 10 mg (10 mg Intravenous Given 05/14/16 0746)  diphenhydrAMINE (BENADRYL) injection 25 mg (25 mg Intravenous Given 05/14/16 0743)  dexamethasone (DECADRON) injection 10 mg (10 mg Intravenous Given 05/14/16 0741)  sodium chloride 0.9 % bolus 1,000 mL (0 mLs Intravenous Stopped 05/14/16 0934)  ondansetron (ZOFRAN-ODT) disintegrating tablet 4 mg (4 mg Oral Given 05/14/16 0742)  ketorolac (TORADOL) 30 MG/ML injection 30 mg (30 mg Intravenous Given 05/14/16 0931)  valproate (DEPACON) 500 mg in dextrose 5 % 50 mL  IVPB (0 mg Intravenous Stopped 05/14/16 1044)  ondansetron (ZOFRAN) injection 4 mg (4 mg Intravenous Given 05/14/16 1127)     Initial Impression / Assessment and Plan / ED Course  I have reviewed the triage vital signs and the nursing notes.  Pertinent labs & imaging results that were available during my care of the patient were reviewed by me and considered in my medical decision making (see chart for details).  Clinical Course    Headache with symptoms that sound like a migraine. Photophobia, bitemporal throbbing headache, nausea. CT is normal and reassuring. Given IV Reglan, Toradol, Benadryl, Decadron and Depakote. States her headache is down to a 4. Resume simple pain medication. I think she is appropriate for discharge home. She is resting comfortably  smiling animated and interactive. Rest. Avoid bright lights. Avoid cell phones, ectopic devices, television. Limited number of Vicodin to use for tonight and tomorrow. Hopefully she will obtain some resolution of the remainder of her headache with rest. Neurological consultation regarding new onset migraine headache.  Final Clinical Impressions(s) / ED Diagnoses   Final diagnoses:  Other migraine without status migrainosus, not intractable    New Prescriptions Discharge Medication List as of 05/14/2016 12:56 PM    START taking these medications   Details  ondansetron (ZOFRAN ODT) 4 MG disintegrating tablet Take 1 tablet (4 mg total) by mouth every 8 (eight) hours as needed for nausea., Starting Wed 05/14/2016, Print    oxyCODONE-acetaminophen (PERCOCET/ROXICET) 5-325 MG tablet Take 2 tablets by mouth every 4 (four) hours as needed., Starting Wed 05/14/2016, Print         Tanna Furry, MD 05/14/16 1642

## 2016-05-14 NOTE — ED Notes (Signed)
Pt returned from CT °

## 2016-05-14 NOTE — ED Notes (Signed)
Pt reports she feels the symptoms of a migraine again without improvement. MD aware.

## 2016-05-14 NOTE — ED Notes (Signed)
Pt ambulated to the BR with steady gait.   

## 2016-05-14 NOTE — ED Triage Notes (Signed)
Headache since Thursday with nausea, dizziness, and photosensitivity. Pt states she feels shaky and dizzy when she gets up

## 2016-05-14 NOTE — Discharge Instructions (Signed)
Rest today. No TV, no computer screens, no electronic devices.  Pain medicine as prescribed. Zofran for nausea as prescribed.  I recommend neurological consultation regarding your migraine headache diagnosis.

## 2016-05-20 ENCOUNTER — Encounter: Payer: Managed Care, Other (non HMO) | Admitting: Family Medicine

## 2016-05-20 ENCOUNTER — Ambulatory Visit (INDEPENDENT_AMBULATORY_CARE_PROVIDER_SITE_OTHER): Payer: Managed Care, Other (non HMO) | Admitting: Family Medicine

## 2016-05-20 VITALS — BP 120/86 | HR 100 | Temp 97.9°F | Ht 60.0 in | Wt 155.6 lb

## 2016-05-20 DIAGNOSIS — Z23 Encounter for immunization: Secondary | ICD-10-CM

## 2016-05-20 DIAGNOSIS — D229 Melanocytic nevi, unspecified: Secondary | ICD-10-CM

## 2016-05-20 DIAGNOSIS — Z8632 Personal history of gestational diabetes: Secondary | ICD-10-CM

## 2016-05-20 DIAGNOSIS — Z0000 Encounter for general adult medical examination without abnormal findings: Secondary | ICD-10-CM

## 2016-05-20 DIAGNOSIS — E010 Iodine-deficiency related diffuse (endemic) goiter: Secondary | ICD-10-CM

## 2016-05-20 LAB — COMPLETE METABOLIC PANEL WITH GFR
ALT: 22 U/L (ref 6–29)
AST: 18 U/L (ref 10–30)
Albumin: 4.5 g/dL (ref 3.6–5.1)
Alkaline Phosphatase: 94 U/L (ref 33–115)
BUN: 13 mg/dL (ref 7–25)
CHLORIDE: 103 mmol/L (ref 98–110)
CO2: 24 mmol/L (ref 20–31)
CREATININE: 0.73 mg/dL (ref 0.50–1.10)
Calcium: 9.7 mg/dL (ref 8.6–10.2)
GFR, Est African American: >89 mL/min (ref >=60)
GFR, Est Non African American: >89 mL/min (ref >=60)
GLUCOSE: 87 mg/dL (ref 65–99)
Potassium: 4.1 mmol/L (ref 3.5–5.3)
SODIUM: 138 mmol/L (ref 135–146)
Total Bilirubin: 0.5 mg/dL (ref 0.2–1.2)
Total Protein: 7.5 g/dL (ref 6.1–8.1)

## 2016-05-20 LAB — LIPID PANEL
Cholesterol: 229 mg/dL — ABNORMAL HIGH (ref 125–200)
HDL: 39 mg/dL — ABNORMAL LOW (ref >=46)
TRIGLYCERIDES: 701 mg/dL — AB (ref <150)
Total CHOL/HDL Ratio: 5.9 Ratio — ABNORMAL HIGH (ref <=5.0)

## 2016-05-20 LAB — TSH: TSH: 2.89 mIU/L

## 2016-05-20 LAB — POCT GLYCOSYLATED HEMOGLOBIN (HGB A1C): Hemoglobin A1C: 5.3

## 2016-05-20 LAB — T4, FREE: FREE T4: 1.3 ng/dL (ref 0.8–1.8)

## 2016-05-20 MED ORDER — RANITIDINE HCL 150 MG PO TABS: 150.0000 mg | ORAL_TABLET | Freq: Two times a day (BID) | ORAL | 5 refills | Status: DC | PRN

## 2016-05-20 NOTE — Patient Instructions (Addendum)
Talk with your OB/GYN about birth control next month when you go for your pap.  Labs today: kidneys, liver, diabetes, thyroid  Flu shot today  Sent in zantac for heartburn  For weight management: Call Dr. Jenne Campus (our nutritionist) to set up an appointment. Her phone number is: [phone].   Follow up with me in 1 year, sooner if needed.  Be well, Dr. Ardelia Mems   Health Maintenance, Female Adopting a healthy lifestyle and getting preventive care can go a long way to promote health and wellness. Talk with your health care provider about what schedule of regular examinations is right for you. This is a good chance for you to check in with your provider about disease prevention and staying healthy. In between checkups, there are plenty of things you can do on your own. Experts have done a lot of research about which lifestyle changes and preventive measures are most likely to keep you healthy. Ask your health care provider for more information. WEIGHT AND DIET  Eat a healthy diet  Be sure to include plenty of vegetables, fruits, low-fat dairy products, and lean protein.  Do not eat a lot of foods high in solid fats, added sugars, or salt.  Get regular exercise. This is one of the most important things you can do for your health.  Most adults should exercise for at least 150 minutes each week. The exercise should increase your heart rate and make you sweat (moderate-intensity exercise).  Most adults should also do strengthening exercises at least twice a week. This is in addition to the moderate-intensity exercise.  Maintain a healthy weight  Body mass index (BMI) is a measurement that can be used to identify possible weight problems. It estimates body fat based on height and weight. Your health care provider can help determine your BMI and help you achieve or maintain a healthy weight.  For females 10 years of age and older:   A BMI below 18.5 is considered underweight.  A BMI of 18.5  to 24.9 is normal.  A BMI of 25 to 29.9 is considered overweight.  A BMI of 30 and above is considered obese.  Watch levels of cholesterol and blood lipids  You should start having your blood tested for lipids and cholesterol at 31 years of age, then have this test every 5 years.  You may need to have your cholesterol levels checked more often if:  Your lipid or cholesterol levels are high.  You are older than 31 years of age.  You are at high risk for heart disease.  CANCER SCREENING   Lung Cancer  Lung cancer screening is recommended for adults 59-74 years old who are at high risk for lung cancer because of a history of smoking.  A yearly low-dose CT scan of the lungs is recommended for people who:  Currently smoke.  Have quit within the past 15 years.  Have at least a 30-pack-year history of smoking. A pack year is smoking an average of one pack of cigarettes a day for 1 year.  Yearly screening should continue until it has been 15 years since you quit.  Yearly screening should stop if you develop a health problem that would prevent you from having lung cancer treatment.  Breast Cancer  Practice breast self-awareness. This means understanding how your breasts normally appear and feel.  It also means doing regular breast self-exams. Let your health care provider know about any changes, no matter how small.  If you are in  your 20s or 30s, you should have a clinical breast exam (CBE) by a health care provider every 1-3 years as part of a regular health exam.  If you are 40 or older, have a CBE every year. Also consider having a breast X-ray (mammogram) every year.  If you have a family history of breast cancer, talk to your health care provider about genetic screening.  If you are at high risk for breast cancer, talk to your health care provider about having an MRI and a mammogram every year.  Breast cancer gene (BRCA) assessment is recommended for women who have  family members with BRCA-related cancers. BRCA-related cancers include:  Breast.  Ovarian.  Tubal.  Peritoneal cancers.  Results of the assessment will determine the need for genetic counseling and BRCA1 and BRCA2 testing. Cervical Cancer Your health care provider may recommend that you be screened regularly for cancer of the pelvic organs (ovaries, uterus, and vagina). This screening involves a pelvic examination, including checking for microscopic changes to the surface of your cervix (Pap test). You may be encouraged to have this screening done every 3 years, beginning at age 21.  For women ages 30-65, health care providers may recommend pelvic exams and Pap testing every 3 years, or they may recommend the Pap and pelvic exam, combined with testing for human papilloma virus (HPV), every 5 years. Some types of HPV increase your risk of cervical cancer. Testing for HPV may also be done on women of any age with unclear Pap test results.  Other health care providers may not recommend any screening for nonpregnant women who are considered low risk for pelvic cancer and who do not have symptoms. Ask your health care provider if a screening pelvic exam is right for you.  If you have had past treatment for cervical cancer or a condition that could lead to cancer, you need Pap tests and screening for cancer for at least 20 years after your treatment. If Pap tests have been discontinued, your risk factors (such as having a new sexual partner) need to be reassessed to determine if screening should resume. Some women have medical problems that increase the chance of getting cervical cancer. In these cases, your health care provider may recommend more frequent screening and Pap tests. Colorectal Cancer  This type of cancer can be detected and often prevented.  Routine colorectal cancer screening usually begins at 31 years of age and continues through 31 years of age.  Your health care provider may  recommend screening at an earlier age if you have risk factors for colon cancer.  Your health care provider may also recommend using home test kits to check for hidden blood in the stool.  A small camera at the end of a tube can be used to examine your colon directly (sigmoidoscopy or colonoscopy). This is done to check for the earliest forms of colorectal cancer.  Routine screening usually begins at age 50.  Direct examination of the colon should be repeated every 5-10 years through 31 years of age. However, you may need to be screened more often if early forms of precancerous polyps or small growths are found. Skin Cancer  Check your skin from head to toe regularly.  Tell your health care provider about any new moles or changes in moles, especially if there is a change in a mole's shape or color.  Also tell your health care provider if you have a mole that is larger than the size of a pencil   eraser.  Always use sunscreen. Apply sunscreen liberally and repeatedly throughout the day.  Protect yourself by wearing long sleeves, pants, a wide-brimmed hat, and sunglasses whenever you are outside. HEART DISEASE, DIABETES, AND HIGH BLOOD PRESSURE   High blood pressure causes heart disease and increases the risk of stroke. High blood pressure is more likely to develop in:  People who have blood pressure in the high end of the normal range (130-139/85-89 mm Hg).  People who are overweight or obese.  People who are African American.  If you are 18-39 years of age, have your blood pressure checked every 3-5 years. If you are 40 years of age or older, have your blood pressure checked every year. You should have your blood pressure measured twice--once when you are at a hospital or clinic, and once when you are not at a hospital or clinic. Record the average of the two measurements. To check your blood pressure when you are not at a hospital or clinic, you can use:  An automated blood pressure  machine at a pharmacy.  A home blood pressure monitor.  If you are between 55 years and 79 years old, ask your health care provider if you should take aspirin to prevent strokes.  Have regular diabetes screenings. This involves taking a blood sample to check your fasting blood sugar level.  If you are at a normal weight and have a low risk for diabetes, have this test once every three years after 31 years of age.  If you are overweight and have a high risk for diabetes, consider being tested at a younger age or more often. PREVENTING INFECTION  Hepatitis B  If you have a higher risk for hepatitis B, you should be screened for this virus. You are considered at high risk for hepatitis B if:  You were born in a country where hepatitis B is common. Ask your health care provider which countries are considered high risk.  Your parents were born in a high-risk country, and you have not been immunized against hepatitis B (hepatitis B vaccine).  You have HIV or AIDS.  You use needles to inject street drugs.  You live with someone who has hepatitis B.  You have had sex with someone who has hepatitis B.  You get hemodialysis treatment.  You take certain medicines for conditions, including cancer, organ transplantation, and autoimmune conditions. Hepatitis C  Blood testing is recommended for:  Everyone born from 1945 through 1965.  Anyone with known risk factors for hepatitis C. Sexually transmitted infections (STIs)  You should be screened for sexually transmitted infections (STIs) including gonorrhea and chlamydia if:  You are sexually active and are younger than 31 years of age.  You are older than 31 years of age and your health care provider tells you that you are at risk for this type of infection.  Your sexual activity has changed since you were last screened and you are at an increased risk for chlamydia or gonorrhea. Ask your health care provider if you are at risk.  If  you do not have HIV, but are at risk, it may be recommended that you take a prescription medicine daily to prevent HIV infection. This is called pre-exposure prophylaxis (PrEP). You are considered at risk if:  You are sexually active and do not regularly use condoms or know the HIV status of your partner(s).  You take drugs by injection.  You are sexually active with a partner who has HIV. Talk with   your health care provider about whether you are at high risk of being infected with HIV. If you choose to begin PrEP, you should first be tested for HIV. You should then be tested every 3 months for as long as you are taking PrEP.  PREGNANCY   If you are premenopausal and you may become pregnant, ask your health care provider about preconception counseling.  If you may become pregnant, take 400 to 800 micrograms (mcg) of folic acid every day.  If you want to prevent pregnancy, talk to your health care provider about birth control (contraception). OSTEOPOROSIS AND MENOPAUSE   Osteoporosis is a disease in which the bones lose minerals and strength with aging. This can result in serious bone fractures. Your risk for osteoporosis can be identified using a bone density scan.  If you are 65 years of age or older, or if you are at risk for osteoporosis and fractures, ask your health care provider if you should be screened.  Ask your health care provider whether you should take a calcium or vitamin D supplement to lower your risk for osteoporosis.  Menopause may have certain physical symptoms and risks.  Hormone replacement therapy may reduce some of these symptoms and risks. Talk to your health care provider about whether hormone replacement therapy is right for you.  HOME CARE INSTRUCTIONS   Schedule regular health, dental, and eye exams.  Stay current with your immunizations.   Do not use any tobacco products including cigarettes, chewing tobacco, or electronic cigarettes.  If you are  pregnant, do not drink alcohol.  If you are breastfeeding, limit how much and how often you drink alcohol.  Limit alcohol intake to no more than 1 drink per day for nonpregnant women. One drink equals 12 ounces of beer, 5 ounces of wine, or 1 ounces of hard liquor.  Do not use street drugs.  Do not share needles.  Ask your health care provider for help if you need support or information about quitting drugs.  Tell your health care provider if you often feel depressed.  Tell your health care provider if you have ever been abused or do not feel safe at home.   This information is not intended to replace advice given to you by your health care provider. Make sure you discuss any questions you have with your health care provider.   Document Released: 02/03/2011 Document Revised: 08/11/2014 Document Reviewed: 06/22/2013 [employer] Interactive Patient Education 2016 [employer]  

## 2016-05-20 NOTE — Progress Notes (Signed)
Date of Visit: 05/20/2016   HPI:  Patient presents today for a well woman exam.   Concerns today: see below Periods: irregular.was 2 weeks late on period last time, had more cramping than normal. Contraception: not on birth contorl, required IUI to concieve her daughter (81mos old). Has upcoming appointment with OB/GYN for pap Pelvic symptoms: no discharge or pelvic pain Sexual activity: husband STD Screening: denies exposures, declines screening Pap smear status: has appointment with OBGYN for this Exercise: none Diet: none Smoking: none Alcohol: none Drugs: none Mood: no concerns Dentist: yes No longer breastfeeding  Headaches - had bad headaches a few weeks ago. No aura. Seen here and then also had to go to ED. Was dizzy with headache. This was a new headache, no history of migraine in the past. Has appointment with neurology on 10/25 to be evaluated.   Diabetes screening - wants to be tested for diabetes. Has strong family history. Had GDM during her pregnancy.  Thyroid testing - wants thyroid testing. Has been told it's been abnormal in the past. Did not get tested during her pregnancy.  GERD - takes as needed zantac. Works well. Would like rx for this.  Weight - willing to meet with nutritionist  Moles - has some moles she wants evaluated. Wants to see derm for them.   ROS: See HPI  [place]:  Cancers in family: none Family history of hypertension in grandparents. Grandmother had brain aneurysm. Strong family history of type 2 diabetes   PHYSICAL EXAM: BP 120/86   Pulse 100   Temp 97.9 F (36.6 C) (Oral)   Ht 5' (1.524 m)   Wt 155 lb 9.6 oz (70.6 kg)   LMP 05/13/2016   BMI 30.39 kg/m  Gen: NAD, pleasant, cooperative HEENT: NCAT, PERRL, thyroid mildly enlarged. No nodules or anterior cervical lymphadenopathy Heart: RRR, no murmurs Lungs: CTAB, NWOB Abdomen: soft, nontender to palpation Neuro: grossly nonfocal, speech normal  ASSESSMENT/PLAN:  Health  maintenance:  -STD screening: declines -pap smear: has app twith ob/gyn -lipid screening: check lipids & CMET today -immunizations: flu shot today -other lab screening: check A1c, thyroid labs today -weight - given phone # for Dr. Jenne Campus -skin/mole changes - refer to derm per patient preference -contraception - patient wanting birth control. Discussed recommendation to wait until after her neuro appointment to discuss bc options. She will talk about it with her OB/GYN next month during that appt -handout given on health maintenance topics  GERD rx for zantac for as needed use  Headache Resolved, await neuro visit for further evaluation  FOLLOW UP: Follow up in 1 year, sooner if needed Refer to dermatology  [place] J. Ardelia Mems, [place]

## 2016-05-21 LAB — T3: T3 TOTAL: 150 ng/dL (ref 76–181)

## 2016-05-28 ENCOUNTER — Ambulatory Visit (INDEPENDENT_AMBULATORY_CARE_PROVIDER_SITE_OTHER): Payer: Managed Care, Other (non HMO) | Admitting: Neurology

## 2016-05-28 ENCOUNTER — Encounter: Payer: Self-pay | Admitting: Neurology

## 2016-05-28 ENCOUNTER — Encounter: Payer: Self-pay | Admitting: Family Medicine

## 2016-05-28 DIAGNOSIS — G43009 Migraine without aura, not intractable, without status migrainosus: Secondary | ICD-10-CM | POA: Diagnosis not present

## 2016-05-28 DIAGNOSIS — G43909 Migraine, unspecified, not intractable, without status migrainosus: Secondary | ICD-10-CM | POA: Insufficient documentation

## 2016-05-28 MED ORDER — RIZATRIPTAN BENZOATE 5 MG PO TBDP: 5.0000 mg | ORAL_TABLET | ORAL | 6 refills | Status: DC | PRN

## 2016-05-28 NOTE — Progress Notes (Signed)
PATIENT: Sherry Shepherd DOB: 1985-08-31  Chief Complaint  Patient presents with  . Migraine    She started having a headache on 05/08/16 and the pain failed to respond to OTC NSAIDS.  Over the following week, the pain continued to worsen and became severe enough that she went to the ED on 05/14/16 for treatment.  She had a normal CT head and was diagnosed with a migraine.  She has no history of previous migraines.  Marland Kitchen PCP    Leeanne Rio, MD     HISTORICAL  Sherry Shepherd is a 30 years old right-handed female, seen in refer by her primary care doctor Leeanne Rio for evaluation of headaches, initial evaluation was May 28 2016.  She had a history of gestational diabetes, otherwise was healthy, denying history of migraine headaches, she began to experience gradual building up of headache since May 08 2016, gradually getting worse, by May 12 2016, it has become a severe pounding headache, with associated light noise sensitivity, nauseous, difficulty falling to sleep, failed to improve by taking ibuprofen 800 mg, in combination with Tylenol, she eventually presented to the emergency room on May 14 2016, was given decardron 10mg , Benadryl 25mg , toradol, reglan, zofran, depacone, her headache finally improved, she was able to sleep the rest to days to total recover.   She denies lateralized motor or sensory deficit  I personally reviewed CT head without contrast May 14 2016 that was normal. Laboratory evaluation, normal TSH, CMP, elevated cholesterol 229, triglyceride 901, A1c 5.3,  REVIEW OF SYSTEMS: Full 14 system review of systems performed and notable only for as above  ALLERGIES: Allergies  Allergen Reactions  . Amoxicillin Hives  . Cefprozil Hives    HOME MEDICATIONS: Current Outpatient Prescriptions  Medication Sig Dispense Refill  . Prenatal Vit-Fe Fumarate-FA (PRENATAL MULTIVITAMIN) TABS tablet Take 1 tablet by mouth daily.    . ranitidine (ZANTAC) 150  MG tablet Take 1 tablet (150 mg total) by mouth 2 (two) times daily as needed for heartburn. 60 tablet 5   No current facility-administered medications for this visit.     PAST MEDICAL HISTORY: Past Medical History:  Diagnosis Date  . Allergy   . GERD (gastroesophageal reflux disease)   . Gestational diabetes   . Hx of varicella   . Migraine     PAST SURGICAL HISTORY: Past Surgical History:  Procedure Laterality Date  . DENTAL SURGERY      FAMILY HISTORY: Family History  Problem Relation Age of Onset  . Diabetes Mother   . Macular degeneration Mother   . Diabetes Father   . Hyperlipidemia Father   . Birth defects Brother     tongue tied    SOCIAL HISTORY:  Social History   Social History  . Marital status: Married    Spouse name: N/A  . Number of children: 1  . Years of education: Masters   Occupational History  . Mental Health Counselor/Program Manager    Social History Main Topics  . Smoking status: Never Smoker  . Smokeless tobacco: Never Used  . Alcohol use No  . Drug use: No  . Sexual activity: Yes    Birth control/ protection: None   Other Topics Concern  . Not on file   Social History Narrative   Lives at home with husband and daughter.   5-6 cups of green tea per day.   Right-handed.        PHYSICAL EXAM   Vitals:  05/28/16 0813  BP: 121/85  Pulse: 97  Weight: 158 lb (71.7 kg)  Height: 5' (1.524 m)    Not recorded      Body mass index is 30.86 kg/m.  PHYSICAL EXAMNIATION:  Gen: NAD, conversant, well nourised, obese, well groomed                     Cardiovascular: Regular rate rhythm, no peripheral edema, warm, nontender. Eyes: Conjunctivae clear without exudates or hemorrhage Neck: Supple, no carotid bruits. Pulmonary: Clear to auscultation bilaterally   NEUROLOGICAL EXAM:  MENTAL STATUS: Speech:    Speech is normal; fluent and spontaneous with normal comprehension.  Cognition:     Orientation to time, place and  person     Normal recent and remote memory     Normal Attention span and concentration     Normal Language, naming, repeating,spontaneous speech     Fund of knowledge   CRANIAL NERVES: CN II: Visual fields are full to confrontation. Fundoscopic exam is normal with sharp discs and no vascular changes. Pupils are round equal and briskly reactive to light. CN III, IV, VI: extraocular movement are normal. No ptosis. CN V: Facial sensation is intact to pinprick in all 3 divisions bilaterally. Corneal responses are intact.  CN VII: Face is symmetric with normal eye closure and smile. CN VIII: Hearing is normal to rubbing fingers CN IX, X: Palate elevates symmetrically. Phonation is normal. CN XI: Head turning and shoulder shrug are intact CN XII: Tongue is midline with normal movements and no atrophy.  MOTOR: There is no pronator drift of out-stretched arms. Muscle bulk and tone are normal. Muscle strength is normal.  REFLEXES: Reflexes are 2+ and symmetric at the biceps, triceps, knees, and ankles. Plantar responses are flexor.  SENSORY: Intact to light touch, pinprick, positional sensation and vibratory sensation are intact in fingers and toes.  COORDINATION: Rapid alternating movements and fine finger movements are intact. There is no dysmetria on finger-to-nose and heel-knee-shin.    GAIT/STANCE: Posture is normal. Gait is steady with normal steps, base, arm swing, and turning. Heel and toe walking are normal. Tandem gait is normal.  Romberg is absent.   DIAGNOSTIC DATA (LABS, IMAGING, TESTING) - I reviewed patient records, labs, notes, testing and imaging myself where available.   ASSESSMENT AND PLAN  Sherry Shepherd is a 30 y.o. female   Migraine headaches  Maxalt as needed Hyperlipidemia  She will call her primary care office for further treatment plan.   Marcial Pacas, M.D. Ph.D.  [facility] [address], [place], [place] [address] Ph: [phone] Fax: [phone]  CC: Referring Provider

## 2016-05-30 ENCOUNTER — Telehealth: Payer: Self-pay | Admitting: Family Medicine

## 2016-05-30 DIAGNOSIS — E781 Pure hyperglyceridemia: Secondary | ICD-10-CM

## 2016-05-30 NOTE — Telephone Encounter (Signed)
Attempted to reach patient to discuss labwork. No answer. Left vm asking her to call back.  When she returns call please let me know so that I can speak with her.  Leeanne Rio, MD

## 2016-06-04 NOTE — Telephone Encounter (Signed)
Called patient again to discuss labs. Reached her this time.  Triglycerides were quite elevated at 701. This was unexpected. Does report family history of hyperlipidemia in her father, diagnosed in his 55s, but he has never been on medication for it previously. Has managed with lifestyle changes.  Since lab is unexpected, we will repeat the lipid panel. Also check direct LDL to assess LDL (since cannot calculate it with elevated triglycerides).  Lab visit scheduled for tomorrow morning. If trig/chol are still elevated will likely plan to start statin. Preemptively discussed with Shelicia that statins CANNOT be used in pregnancy, and that she would need to come off the medication prior to conceiving, should she desire to become pregnant again.  Patient appreciative.  Leeanne Rio, MD

## 2016-06-05 ENCOUNTER — Other Ambulatory Visit: Payer: Managed Care, Other (non HMO)

## 2016-06-05 DIAGNOSIS — E781 Pure hyperglyceridemia: Secondary | ICD-10-CM

## 2016-06-05 LAB — LDL CHOLESTEROL, DIRECT: Direct LDL: 110 mg/dL (ref <130)

## 2016-06-05 LAB — LIPID PANEL
CHOLESTEROL: 195 mg/dL (ref 125–200)
HDL: 35 mg/dL — ABNORMAL LOW (ref >=46)
LDL Cholesterol: 89 mg/dL (ref <130)
TRIGLYCERIDES: 357 mg/dL — AB (ref <150)
Total CHOL/HDL Ratio: 5.6 Ratio — ABNORMAL HIGH (ref <=5.0)
VLDL: 71 mg/dL — ABNORMAL HIGH (ref <30)

## 2016-06-10 ENCOUNTER — Telehealth: Payer: Self-pay | Admitting: Neurology

## 2016-06-10 NOTE — Telephone Encounter (Signed)
Pt called in stating she is going to get back on birth control and was asked to call to get a suggestion as to which birthcontrol to use, per request of her OBGYN ( [place]) Please call and advise [phone]

## 2016-06-10 NOTE — Telephone Encounter (Signed)
Left message for a return call.  Dr. Krista Blue will review her chart.

## 2016-06-11 NOTE — Telephone Encounter (Signed)
Patient returned Michelle's call. °

## 2016-06-11 NOTE — Telephone Encounter (Signed)
She should contact her gynecologist for choice of oral contraceptives,  But in general, low estrogen level, extended treatment cycle, with shortened hormone free interval should be a better choice

## 2016-06-11 NOTE — Telephone Encounter (Signed)
Spoke to patient - she is requesting [place] to contact her 75 office - Dr. Bobbye Charleston.  I called and spoke to [facility]  She would like a statement faxed over stating it is safe for patient to be placed back on birth control.  I will fax over Dr. Rhea Belton recommendations below.

## 2016-07-02 ENCOUNTER — Telehealth: Payer: Self-pay | Admitting: Family Medicine

## 2016-07-02 DIAGNOSIS — E781 Pure hyperglyceridemia: Secondary | ICD-10-CM

## 2016-07-02 NOTE — Telephone Encounter (Signed)
Patient called back. Relayed message to her.  Advised against estrogen containing birth control given elevated triglyceride levels. Has upcoming appointment with Dr. Philis Pique (OB/GYN) to discuss her options further. We will repeat her lipid panel in 3 months. Appointment scheduled for fasting lipids in February.  Reports daughter got the flu the day she had the derm appointment. She has the # to call back and reschedule.  Patient appreciative.  Leeanne Rio, MD

## 2016-07-02 NOTE — Telephone Encounter (Signed)
Attempted to reach Sherry Shepherd to discuss triglyceride results. Labs were better on recheck, but still high. Want to speak with her about these results & what to do moving forward. When she calls back please contact me so I can speak with her.  Will recommend we recheck lipids in 3 months to ensure stability. Also would advise against estrogen-containing BC due to it increasing her cardiovascular risk with increased triglycerides. No need for lipid lowering medication at this time.  Also need to talk with her about the dermatology appointment we had set up for her - I got a letter saying she did not show to that visit.  Leeanne Rio, MD

## 2016-09-09 ENCOUNTER — Other Ambulatory Visit: Payer: Managed Care, Other (non HMO)

## 2016-09-29 ENCOUNTER — Ambulatory Visit: Payer: Managed Care, Other (non HMO) | Admitting: Nurse Practitioner

## 2016-09-30 ENCOUNTER — Encounter: Payer: Self-pay | Admitting: Nurse Practitioner

## 2016-10-03 ENCOUNTER — Ambulatory Visit (INDEPENDENT_AMBULATORY_CARE_PROVIDER_SITE_OTHER): Payer: Managed Care, Other (non HMO) | Admitting: Family Medicine

## 2016-10-03 ENCOUNTER — Encounter: Payer: Self-pay | Admitting: Family Medicine

## 2016-10-03 VITALS — BP 125/90 | HR 115 | Temp 98.3°F | Ht 60.0 in | Wt 163.4 lb

## 2016-10-03 DIAGNOSIS — R058 Other specified cough: Secondary | ICD-10-CM

## 2016-10-03 DIAGNOSIS — R05 Cough: Secondary | ICD-10-CM

## 2016-10-03 MED ORDER — BENZONATATE 100 MG PO CAPS: 100.0000 mg | ORAL_CAPSULE | Freq: Two times a day (BID) | ORAL | 0 refills | Status: DC | PRN

## 2016-10-03 NOTE — Progress Notes (Signed)
    Subjective:  Sherry Shepherd is a 31 y.o. female who presents to the [facility] today with a chief complaint of cough.   HPI: Had cough, congestion, rhinorrhea and fever 3 weeks ago. Was seen at urgent care on 09/20/16 and given 7 day course of doxycycline that she completed on 09/26/16. Felt much improved for a couple of days but cough has continued. Feels like cough has been getting worse over the last 3 days with some SOB/wheezing associated with nasal congestion that only occurs at night. No fever/chills, nausea/vomiting, diarrhea.   ROS: Per HPI   Objective:  Physical Exam: BP 125/90 (BP Location: Left Arm, Patient Position: Sitting, Cuff Size: Normal)   Pulse (!) 115   Temp 98.3 F (36.8 C) (Oral)   Ht 5' (1.524 m)   Wt 163 lb 6.4 oz (74.1 kg)   LMP  (LMP Unknown)   SpO2 98%   BMI 31.91 kg/m   Gen: NAD, resting comfortably HEENT: [place], AT. TMs occluded with cerumen. Nose normal, mucosa non-edematous. Oropharynx unremarkable. No sinus tenderness. Neck: supple, no lymphadenopathy CV: RRR with no murmurs appreciated Pulm: NWOB, CTAB with no crackles, wheezes, or rhonchi GI: Normal bowel sounds present. Soft, Nontender, Nondistended. Skin: warm, dry. No rashes   Assessment/Plan:  Post viral cough vs new onset viral URI. Patient is well appearing without sinus tenderness, oropharynx unremarkable with clear lung exam. Has completed 7 day course of doxycycline and has no risk factors for chronic disease. Most likely post viral cough or less likely new acute viral URI. Advised patient to try supportive care at home, tessalon perles for cough prescribed. Patient given return precautions.  Bufford Lope, DO PGY-1, [facility] 10/03/2016 2:59 PM

## 2016-10-03 NOTE — Patient Instructions (Signed)
It was good to meet you today!  For your cough,  - Please take a spoonful honey or try tessalon perles.for cough.  Let us know if not better in a 1-2 weeks.  Take care and seek immediate care sooner if you develop any concerns.   Dr. Bufford Lope, [place]

## 2016-10-29 ENCOUNTER — Encounter: Payer: Self-pay | Admitting: Student

## 2016-10-29 ENCOUNTER — Ambulatory Visit (INDEPENDENT_AMBULATORY_CARE_PROVIDER_SITE_OTHER): Payer: 59 | Admitting: Student

## 2016-10-29 ENCOUNTER — Ambulatory Visit (HOSPITAL_COMMUNITY)
Admission: RE | Admit: 2016-10-29 | Discharge: 2016-10-29 | Disposition: A | Discharge status: Home / Self Care | Payer: 59 | Source: Ambulatory Visit | Attending: Family Medicine | Admitting: Family Medicine

## 2016-10-29 VITALS — BP 98/62 | HR 100 | Temp 98.6°F | Wt 160.0 lb

## 2016-10-29 DIAGNOSIS — R059 Cough, unspecified: Secondary | ICD-10-CM

## 2016-10-29 DIAGNOSIS — R05 Cough: Secondary | ICD-10-CM | POA: Diagnosis not present

## 2016-10-29 MED ORDER — ALBUTEROL SULFATE HFA 108 (90 BASE) MCG/ACT IN AERS: 2.0000 | INHALATION_SPRAY | Freq: Four times a day (QID) | RESPIRATORY_TRACT | 0 refills | Status: DC | PRN

## 2016-10-29 MED ORDER — LEVOCETIRIZINE DIHYDROCHLORIDE 5 MG PO TABS: 5.0000 mg | ORAL_TABLET | Freq: Every evening | ORAL | 1 refills | Status: DC

## 2016-10-29 MED ORDER — HYDROCODONE-HOMATROPINE 5-1.5 MG/5ML PO SYRP: 5.0000 mL | ORAL_SOLUTION | Freq: Three times a day (TID) | ORAL | 0 refills | Status: DC | PRN

## 2016-10-29 MED ORDER — ALBUTEROL SULFATE (2.5 MG/3ML) 0.083% IN NEBU: 2.5000 mg | INHALATION_SOLUTION | Freq: Four times a day (QID) | RESPIRATORY_TRACT | 1 refills | Status: DC | PRN

## 2016-10-29 NOTE — Patient Instructions (Signed)
Follow up in 1 week if not improving Obtain chest xray Try alka seltzer cold and flu and hycodan for cough You will be called if your chest xray is abnomal

## 2016-10-29 NOTE — Progress Notes (Signed)
   Subjective:    Patient ID: Sherry Shepherd, female    DOB: [birth year], 31 y.o.   MRN: [id number]   CC: cough  HPI: 31 y/o F presents for cough,   Cough - started in mid February, 1.5 months ago at which time she went to urgent care and was given albuterol breathing treatment and a course of doxycycline - after that she felt her cough was getting better but started to worsen and was seen again on 3/2 at the [facility] and was given tessalon pearls - she feels her cough improved but again started to worsen again this week  - she has had " difficulty catching her breath" but is not sure if it is just because of her coughing - she has not had fevers - she tried mucinex and tessason pearls but these dd not help - she denies sore throat, ear pain, myalagias, rash  - of note her daughter started at day care three weeks ago has been intermittently sick with URI symptoms  Smoking status reviewed  Review of Systems  Per HPI, else denies chest pain    Objective:  BP 98/62   Pulse 100   Temp 98.6 F (37 C) (Oral)   Wt 160 lb (72.6 kg)   LMP 10/25/2016   SpO2 97%   BMI 31.25 kg/m  Vitals and nursing note reviewed  General: NAD Cardiac: RRR,  HEENT: no sinus tenderness, bilateral ear canals obstructed by cerumen but no pain during the exam Respiratory: CTAB, normal effort but interrupted by frequent coughing Extremities: no edema or cyanosis. WWP. Skin: warm and dry, no rashes noted    Assessment & Plan:    Cough Continued coughing that now seems to have worsened. I think that this cough does not represent a one continued illness but instead several different ones, especially with the history of her child recently starting day care and getting URIs.  -As she is having significant cough and reports some sensation of shortness of breath will try albuterol inhaler, hycodan cough suppression and encourage otc alka seltzer old and flu - CXR given prolonged cough and reported SOB - follow  up in one week if not improved    Sherry Shepherd A. Lincoln Brigham MD, [facility] Resident PGY-3 Pager [phone]

## 2016-10-29 NOTE — Assessment & Plan Note (Signed)
Continued coughing that now seems to have worsened. I think that this cough does not represent a one continued illness but instead several different ones, especially with the history of her child recently starting day care and getting URIs.  -As she is having significant cough and reports some sensation of shortness of breath will try albuterol inhaler, hycodan cough suppression and encourage otc alka seltzer old and flu - CXR given prolonged cough and reported SOB - follow up in one week if not improved

## 2016-12-02 ENCOUNTER — Encounter: Payer: Self-pay | Admitting: Obstetrics and Gynecology

## 2016-12-02 ENCOUNTER — Ambulatory Visit (INDEPENDENT_AMBULATORY_CARE_PROVIDER_SITE_OTHER): Payer: 59 | Admitting: Obstetrics and Gynecology

## 2016-12-02 VITALS — BP 100/70 | HR 90 | Temp 98.4°F | Wt 157.0 lb

## 2016-12-02 DIAGNOSIS — A084 Viral intestinal infection, unspecified: Secondary | ICD-10-CM

## 2016-12-02 DIAGNOSIS — J029 Acute pharyngitis, unspecified: Secondary | ICD-10-CM

## 2016-12-02 LAB — POCT RAPID STREP A (OFFICE): RAPID STREP A SCREEN: NEGATIVE

## 2016-12-02 NOTE — Progress Notes (Signed)
   Subjective:   Patient ID: Sherry Shepherd, female    DOB: [birth year], 31 y.o.   MRN: [id number]  Patient presents for Same Day Appointment  Chief Complaint  Patient presents with  . Sore Throat    HPI: # SORE THROAT Has had strep before Husband and daughter tested positive for strep yesterday Sore throat began last night. Not severe Medications tried: tylenol Strep throat exposure: yes  Symptoms Fever: no Cough: yes Runny nose: no Muscle aches: no  #Vomiting/diarrhea Started having vomiting and diarrhea Saturday night No one in house with similar symptoms Vomiting has resolved and diarrhea is improving Imodium helped No fevers Some abdominal cramping  Works at [employer]  Review of Systems   See HPI for ROS.   History  Smoking Status  . Never Smoker  Smokeless Tobacco  . Never Used    Objective:  BP 100/70   Pulse 90   Temp 98.4 F (36.9 C) (Oral)   Wt 157 lb (71.2 kg)   LMP 11/27/2016   SpO2 99%   BMI 30.66 kg/m  Vitals and nursing note reviewed  Physical Exam Gen:  31 y.o. female in NAD HEENT: NCAT, MMM, EOMI, PERRL, anicteric sclerae, oropharyngeal clear without exudate or erythema, no cervical adenopathy Abd: soft, non-tender, ND  Results for orders placed or performed in visit on 12/02/16 (from the past 24 hour(s))  Rapid Strep A     Status: None   Collection Time: 12/02/16  2:56 PM  Result Value Ref Range   Rapid Strep A Screen Negative Negative    Assessment & Plan:  1. Sore throat Rapid strep negative. Believe sore throat could just be a viral pharyngitis. No signs of infection. No red flags. Conservative management discussed. Return precautions given.  - Rapid Strep A  2. Viral gastroenteritis Symptoms consistent. Improving. No additional management needed. Discussed staying hydrated. Can continue imodium prn. Conservative measures.   Diagnosis and plan along were discussed in detail with this patient today. The patient verbalized  understanding and agreed with the plan. Patient advised if symptoms worsen return to clinic.   PATIENT EDUCATION PROVIDED: See AVS   Luiz Blare, DO 12/02/2016, 2:15 PM PGY-3, [place]

## 2016-12-02 NOTE — Patient Instructions (Signed)
Strep negative  Viral Gastroenteritis, Adult Viral gastroenteritis is also known as the stomach flu. This condition is caused by certain germs (viruses). These germs can be passed from person to person very easily (are very contagious). This condition can cause sudden watery poop (diarrhea), fever, and throwing up (vomiting). Having watery poop and throwing up can make you feel weak and cause you to get dehydrated. Dehydration can make you tired and thirsty, make you have a dry mouth, and make it so you pee (urinate) less often. Older adults and people with other diseases or a weak defense system (immune system) are at higher risk for dehydration. It is important to replace the fluids that you lose from having watery poop and throwing up. Follow these instructions at home: Follow instructions from your doctor about how to care for yourself at home. Eating and drinking   Follow these instructions as told by your doctor:  Take an oral rehydration solution (ORS). This is a drink that is sold at pharmacies and stores.  Drink clear fluids in small amounts as you are able, such as:  Water.  Ice chips.  Diluted fruit juice.  Low-calorie sports drinks.  Eat bland, easy-to-digest foods in small amounts as you are able, such as:  Bananas.  Applesauce.  Rice.  Low-fat (lean) meats.  Toast.  Crackers.  Avoid fluids that have a lot of sugar or caffeine in them.  Avoid alcohol.  Avoid spicy or fatty foods. General instructions   Drink enough fluid to keep your pee (urine) clear or pale yellow.  Wash your hands often. If you cannot use soap and water, use hand sanitizer.  Make sure that all people in your home wash their hands well and often.  Rest at home while you get better.  Take over-the-counter and prescription medicines only as told by your doctor.  Watch your condition for any changes.  Take a warm bath to help with any burning or pain from having watery poop.  Keep  all follow-up visits as told by your doctor. This is important. Contact a doctor if:  You cannot keep fluids down.  Your symptoms get worse.  You have new symptoms.  You feel light-headed or dizzy.  You have muscle cramps. Get help right away if:  You have chest pain.  You feel very weak or you pass out (faint).  You see blood in your throw-up.  Your throw-up looks like coffee grounds.  You have bloody or black poop (stools) or poop that look like tar.  You have a very bad headache, a stiff neck, or both.  You have a rash.  You have very bad pain, cramping, or bloating in your belly (abdomen).  You have trouble breathing.  You are breathing very quickly.  Your heart is beating very quickly.  Your skin feels cold and clammy.  You feel confused.  You have pain when you pee.  You have signs of dehydration, such as:  Dark pee, hardly any pee, or no pee.  Cracked lips.  Dry mouth.  Sunken eyes.  Sleepiness.  Weakness. This information is not intended to replace advice given to you by your health care provider. Make sure you discuss any questions you have with your health care provider. Document Released: 01/07/2008 Document Revised: 02/08/2016 Document Reviewed: 03/27/2015 Elsevier Interactive Patient Education  2017 [employer].

## 2017-07-22 DIAGNOSIS — Z124 Encounter for screening for malignant neoplasm of cervix: Secondary | ICD-10-CM | POA: Diagnosis not present

## 2017-07-22 DIAGNOSIS — Z6828 Body mass index (BMI) 28.0-28.9, adult: Secondary | ICD-10-CM | POA: Diagnosis not present

## 2017-07-22 DIAGNOSIS — Z01419 Encounter for gynecological examination (general) (routine) without abnormal findings: Secondary | ICD-10-CM | POA: Diagnosis not present

## 2017-07-22 LAB — RESULTS CONSOLE HPV: CHL HPV: NEGATIVE

## 2017-07-22 LAB — HM PAP SMEAR

## 2017-08-21 DIAGNOSIS — R05 Cough: Secondary | ICD-10-CM | POA: Diagnosis not present

## 2017-09-02 DIAGNOSIS — N909 Noninflammatory disorder of vulva and perineum, unspecified: Secondary | ICD-10-CM | POA: Diagnosis not present

## 2017-09-02 DIAGNOSIS — Z6829 Body mass index (BMI) 29.0-29.9, adult: Secondary | ICD-10-CM | POA: Diagnosis not present

## 2017-09-02 DIAGNOSIS — L900 Lichen sclerosus et atrophicus: Secondary | ICD-10-CM | POA: Diagnosis not present

## 2017-09-09 DIAGNOSIS — L900 Lichen sclerosus et atrophicus: Secondary | ICD-10-CM | POA: Insufficient documentation

## 2017-11-02 ENCOUNTER — Ambulatory Visit (INDEPENDENT_AMBULATORY_CARE_PROVIDER_SITE_OTHER): Payer: BLUE CROSS/BLUE SHIELD | Admitting: Family Medicine

## 2017-11-02 DIAGNOSIS — S80869A Insect bite (nonvenomous), unspecified lower leg, initial encounter: Secondary | ICD-10-CM

## 2017-11-02 DIAGNOSIS — W57XXXA Bitten or stung by nonvenomous insect and other nonvenomous arthropods, initial encounter: Secondary | ICD-10-CM | POA: Diagnosis not present

## 2017-11-03 ENCOUNTER — Encounter: Payer: Self-pay | Admitting: Family Medicine

## 2017-11-03 DIAGNOSIS — W57XXXA Bitten or stung by nonvenomous insect and other nonvenomous arthropods, initial encounter: Secondary | ICD-10-CM | POA: Insufficient documentation

## 2017-11-03 DIAGNOSIS — S80869A Insect bite (nonvenomous), unspecified lower leg, initial encounter: Secondary | ICD-10-CM | POA: Insufficient documentation

## 2017-11-03 NOTE — Assessment & Plan Note (Addendum)
Tick head successfully removed.  No antibiotics for now.  Given warning signs of infection - both typical skin infections and specific tick born illnesses.  Low threshold for treating with doxy if develops symptoms.

## 2017-11-03 NOTE — Progress Notes (Signed)
   Subjective:    Patient ID: Sherry Shepherd, female    DOB: [birth year], 32 y.o.   MRN: [id number]  HPI 32 yo female SDA for concern of tick bite. Patient found tick today on calf. Estimates tick was in x 24 hours although unsure.  Removed tick body but concerned about retained head.   Review of Systems     Objective:   Physical Exam Site of tick bite has minimal surrounding erythema.  Black dot in center is presumed tick head.  Initially tried to flick out head with needle, no success.  Then, numbed site with xylocaine and epi.  Able to remove tick had with a little more work with 11 blade scalpel and rubbing with 4x4.  Patient tolerated procedure well.        Assessment & Plan:

## 2017-11-03 NOTE — Patient Instructions (Signed)
Please call promptly if: Worsening redness around bite Fever, headache or generalized rash in the next 7-10 days.

## 2017-11-05 ENCOUNTER — Telehealth: Payer: Self-pay

## 2017-11-05 NOTE — Telephone Encounter (Signed)
Called patient to make appointment and she said that she wanted to call her insurance company first to check some prices. If she still needs to see a doctor she will call back.Sherry Shepherd, CMA

## 2017-11-05 NOTE — Telephone Encounter (Signed)
Unfortunately patient needs to be seen for this before we can prescribe any antibiotics.  Please ask her to schedule an appointment. Leeanne Rio, MD

## 2017-11-05 NOTE — Telephone Encounter (Signed)
Pt called nurse line, states she thinks she has pink eye. Left eye red, sore and swollen and has thick drainage. I offered her an appointment but she requested I send a note back to MD to see if would possibly call in a Rx. States she has a high deductible plan and was here on 11/02/17 for a tick bite. [facility] Her call back [phone] Wallace Cullens, RN

## 2018-01-04 DIAGNOSIS — H6123 Impacted cerumen, bilateral: Secondary | ICD-10-CM | POA: Diagnosis not present

## 2018-01-04 DIAGNOSIS — H938X2 Other specified disorders of left ear: Secondary | ICD-10-CM | POA: Diagnosis not present

## 2018-01-05 ENCOUNTER — Ambulatory Visit: Payer: BLUE CROSS/BLUE SHIELD | Admitting: Internal Medicine

## 2018-07-23 ENCOUNTER — Ambulatory Visit (INDEPENDENT_AMBULATORY_CARE_PROVIDER_SITE_OTHER): Payer: BLUE CROSS/BLUE SHIELD | Admitting: Family Medicine

## 2018-07-23 VITALS — BP 100/80 | HR 81 | Temp 98.2°F | Wt 149.8 lb

## 2018-07-23 DIAGNOSIS — R1310 Dysphagia, unspecified: Secondary | ICD-10-CM | POA: Diagnosis not present

## 2018-07-23 DIAGNOSIS — K219 Gastro-esophageal reflux disease without esophagitis: Secondary | ICD-10-CM | POA: Diagnosis not present

## 2018-07-23 DIAGNOSIS — D229 Melanocytic nevi, unspecified: Secondary | ICD-10-CM | POA: Diagnosis not present

## 2018-07-23 MED ORDER — OMEPRAZOLE 40 MG PO CPDR: 40.0000 mg | DELAYED_RELEASE_CAPSULE | Freq: Every day | ORAL | 0 refills | Status: DC

## 2018-07-23 NOTE — Patient Instructions (Signed)
It was great seeing you today! I am sorry you have been having so much trouble with your reflux. I will switch you back to omeprazole, I have sent this in to [facility]. I will also place a referral to gastroenterology.  I think all of your moles look fine. We reviewed concerning findings to look for. Please come back and see [place] if anything changes. You can also come back and see [place] for a physical at your convenience.

## 2018-07-26 ENCOUNTER — Encounter: Payer: Self-pay | Admitting: Family Medicine

## 2018-07-26 DIAGNOSIS — K219 Gastro-esophageal reflux disease without esophagitis: Secondary | ICD-10-CM | POA: Insufficient documentation

## 2018-07-26 DIAGNOSIS — D229 Melanocytic nevi, unspecified: Secondary | ICD-10-CM | POA: Insufficient documentation

## 2018-07-26 DIAGNOSIS — R1310 Dysphagia, unspecified: Secondary | ICD-10-CM | POA: Insufficient documentation

## 2018-07-26 NOTE — Assessment & Plan Note (Signed)
Patient with symptoms consistent with trouble swallowing and secondary peristalsis to help transition food past lower esophageal sphincter.  Unclear if she does not sugar-free well, has a tight sphincter, or has an esophageal stricture.  Again patient requested referral to GI to evaluate this.  In the meantime counseled patient to chew her food very well.

## 2018-07-26 NOTE — Progress Notes (Signed)
   HPI 32 year old who presents for swallowing issues and reflux type symptoms.  She states that for the last month she has had some times daily but almost always nightly GERD.  She is taking Zantac and had been on Zantac for a few years.  She went to see how she did off of it after the recall.  She states that she was taking omeprazole and that worked really well for her prior to her pregnancy.  That is when she is switched to Zantac.  Patient also states that she has been having some "swallowing issues".  When she does not chew her food very well she can notice that the food, feels a get stuck.  She notices it slowly going down her esophagus and and eventually clears.  She is interested in a GI referral for work-up for this and also for her GERD.  Patient is also concerned about melanoma as it "runs in her family".  She would like to have some moles looked at.  States that she has not noticed any other change aside from 1 in her right upper quadrant of her abdomen.  She states that she did "foolishly" use a tanning bed when she was younger.  CC: GERD  ROS:   Review of Systems See HPI for ROS.   CC, SH/smoking status, and VS noted  Objective: BP 100/80   Pulse 81   Temp 98.2 F (36.8 C) (Oral)   Wt 149 lb 12.8 oz (67.9 kg)   SpO2 99%   BMI 29.26 kg/m  Gen: Pleasant 32 year old female, resting comfortably no acute distress HEENT: NCAT, no erythema or ulcerations in oral mucosa CV: RRR, no murmur Resp: CTAB, no wheezes, non-labored Abd: SNTND, BS present, no guarding or organomegaly Ext: No edema, warm Neuro: Alert and oriented, Speech clear, No gross deficits Skin: Multiple well-circumscribed, homogenous, small nevi, nonraised nevi on back.  One nevi noted in right upper quadrant abdomen, flat well-circumscribed, homogenous   Assessment and plan:  Gastroesophageal reflux disease Patient with GERD.  Describes at least nightly symptoms with sometimes having symptoms in the  daytime.  Well managed with PPI.  Requesting GI referral to "see if there is anything else that can be done".  Is worried about Barrett's esophagus. -Omeprazole 40 mg daily -Referral placed to GI  Dysphagia Patient with symptoms consistent with trouble swallowing and secondary peristalsis to help transition food past lower esophageal sphincter.  Unclear if she does not sugar-free well, has a tight sphincter, or has an esophageal stricture.  Again patient requested referral to GI to evaluate this.  In the meantime counseled patient to chew her food very well.  Multiple nevi Multiple melanocytic nevi with benign features.  Educated patient on alarm signs such as noncircular appearance, loss of homogeneity, and changes in size and shape.  Patient to follow-up PRN for skin check.   Orders Placed This Encounter  Procedures  . Ambulatory referral to Gastroenterology    Referral Priority:   Routine    Referral Type:   Consultation    Referral Reason:   Specialty Services Required    Number of Visits Requested:   1    Meds ordered this encounter  Medications  . omeprazole (PRILOSEC) 40 MG capsule    Sig: Take 1 capsule (40 mg total) by mouth daily.    Dispense:  90 capsule    Refill:  0     Guadalupe Dawn MD PGY-2 Family Medicine Resident  07/26/2018 9:54 AM

## 2018-07-26 NOTE — Assessment & Plan Note (Signed)
Patient with GERD.  Describes at least nightly symptoms with sometimes having symptoms in the daytime.  Well managed with PPI.  Requesting GI referral to "see if there is anything else that can be done".  Is worried about Barrett's esophagus. -Omeprazole 40 mg daily -Referral placed to GI

## 2018-07-26 NOTE — Assessment & Plan Note (Signed)
Multiple melanocytic nevi with benign features.  Educated patient on alarm signs such as noncircular appearance, loss of homogeneity, and changes in size and shape.  Patient to follow-up PRN for skin check.

## 2018-08-12 ENCOUNTER — Encounter: Payer: Self-pay | Admitting: Family Medicine

## 2018-08-27 ENCOUNTER — Encounter: Payer: Self-pay | Admitting: Family Medicine

## 2018-08-27 ENCOUNTER — Other Ambulatory Visit: Payer: Self-pay

## 2018-08-27 NOTE — Telephone Encounter (Signed)
Patient is experiencing first migraine in three years although thought she had one earlier in month that Excedrin migraine helped. OTC not helping this one and now experiencing nausea. Asks for refill of Maxalt until she can get an appt here or with neurology.  Danley Danker, RN [facility] RN)

## 2018-09-01 ENCOUNTER — Encounter: Payer: Self-pay | Admitting: Gastroenterology

## 2018-09-01 ENCOUNTER — Ambulatory Visit (INDEPENDENT_AMBULATORY_CARE_PROVIDER_SITE_OTHER): Payer: BLUE CROSS/BLUE SHIELD | Admitting: Gastroenterology

## 2018-09-01 VITALS — BP 90/70 | HR 96 | Ht 61.0 in | Wt 150.5 lb

## 2018-09-01 DIAGNOSIS — K219 Gastro-esophageal reflux disease without esophagitis: Secondary | ICD-10-CM | POA: Diagnosis not present

## 2018-09-01 DIAGNOSIS — R1310 Dysphagia, unspecified: Secondary | ICD-10-CM

## 2018-09-01 DIAGNOSIS — R1319 Other dysphagia: Secondary | ICD-10-CM

## 2018-09-01 MED ORDER — RIZATRIPTAN BENZOATE 5 MG PO TBDP: 5.0000 mg | ORAL_TABLET | ORAL | 1 refills | Status: DC | PRN

## 2018-09-01 NOTE — Telephone Encounter (Signed)
Called patient to ask if she still needs this since I am only now getting to this refill request. She says she found some of her prior rx and took it and it helped. Would like another rx for maxalt to have on hand. Will send in rx. Also advised she is due for follow up with me, she agreed to have physical. Appointment scheduled for several weeks from now. Patient appreciative. Leeanne Rio, MD

## 2018-09-01 NOTE — Progress Notes (Signed)
[facility] Consult Note:  History: Sherry Shepherd 09/01/2018  Referring physician: Leeanne Rio, MD  Reason for consult/chief complaint: Dysphagia (can feel food when it goes down and sometimes it gets stuck) and Gastroesophageal Reflux (for many years)   Subjective  HPI:  Sherry Shepherd has over 10 years of reflux symptoms with regurgitation and pyrosis.  She took ranitidine for many years and would periodically stop it to see how symptoms were.  She would always have recurrence of symptoms off medicines.  I reviewed her December 20 primary care note, outlining worsening problems with GERD symptoms, especially at night.  She is somewhat improved after change from ranitidine to omeprazole.  Sherry Shepherd also complains of dysphagia where food will feel stuck in the midesophagus.  Although the reflux symptoms and dysphagia are improved on omeprazole, she is not sure if this is the best long-term solution. She denies nausea or vomiting, appetite is been good and weight stable.  ROS:  Review of Systems  Constitutional: Negative for appetite change and unexpected weight change.  HENT: Negative for mouth sores and voice change.   Eyes: Negative for pain and redness.  Respiratory: Negative for cough and shortness of breath.   Cardiovascular: Negative for chest pain and palpitations.  Genitourinary: Negative for dysuria and hematuria.  Musculoskeletal: Negative for arthralgias and myalgias.  Skin: Negative for pallor and rash.  Neurological: Negative for weakness and headaches.  Hematological: Negative for adenopathy.     Past Medical History: Past Medical History:  Diagnosis Date  . Allergy   . GERD (gastroesophageal reflux disease)   . Gestational diabetes   . Hx of varicella   . Lichen sclerosus of female genitalia   . Migraine      Past Surgical History: Past Surgical History:  Procedure Laterality Date  . DENTAL SURGERY       Family History: Family  History  Problem Relation Age of Onset  . Diabetes Mother   . Macular degeneration Mother   . Diabetes Father   . Hyperlipidemia Father   . Birth defects Brother        tongue tied  . Congestive Heart Failure Paternal Grandfather     Social History: Social History   Socioeconomic History  . Marital status: Married    Spouse name: Not on file  . Number of children: 1  . Years of education: Masters  . Highest education level: Not on file  Occupational History  . Occupation: Mental Health Psychologist, occupational  Social Needs  . Financial resource strain: Not on file  . Food insecurity:    Worry: Not on file    Inability: Not on file  . Transportation needs:    Medical: Not on file    Non-medical: Not on file  Tobacco Use  . Smoking status: Never Smoker  . Smokeless tobacco: Never Used  Substance and Sexual Activity  . Alcohol use: No  . Drug use: No  . Sexual activity: Yes    Birth control/protection: None  Lifestyle  . Physical activity:    Days per week: Not on file    Minutes per session: Not on file  . Stress: Not on file  Relationships  . Social connections:    Talks on phone: Not on file    Gets together: Not on file    Attends religious service: Not on file    Active member of club or organization: Not on file    Attends meetings of clubs or organizations: Not on  file    Relationship status: Not on file  Other Topics Concern  . Not on file  Social History Narrative   Lives at home with husband and daughter.   5-6 cups of green tea per day.   Right-handed.   Has a 80-year-old child  Allergies: Allergies  Allergen Reactions  . Amoxicillin Hives  . Cefprozil Hives    Outpatient Meds: Current Outpatient Medications  Medication Sig Dispense Refill  . NORLYDA 0.35 MG tablet Take 1 tablet by mouth daily.    Marland Kitchen omeprazole (PRILOSEC) 40 MG capsule Take 1 capsule (40 mg total) by mouth daily. 90 capsule 0  . rizatriptan (MAXALT-MLT) 5 MG  disintegrating tablet Take 1 tablet (5 mg total) by mouth as needed. May repeat in 2 hours if needed 9 tablet 1   No current facility-administered medications for this visit.       ___________________________________________________________________ Objective   Exam:  BP 90/70 (BP Location: Left Arm, Patient Position: Sitting, Cuff Size: Normal)   Pulse 96   Ht 5\' 1"  (1.549 m) Comment: height measured without shoes  Wt 150 lb 8 oz (68.3 kg)   LMP 08/20/2018   Breastfeeding No   BMI 28.44 kg/m    General: this is a(n) well-appearing woman  Eyes: sclera anicteric, no redness  ENT: oral mucosa moist without lesions, no cervical or supraclavicular lymphadenopathy  CV: RRR without murmur, S1/S2, no JVD, no peripheral edema  Resp: clear to auscultation bilaterally, normal RR and effort noted  GI: soft, no tenderness, with active bowel sounds. No guarding or palpable organomegaly noted.  Skin; warm and dry, no rash or jaundice noted  Neuro: awake, alert and oriented x 3. Normal gross motor function and fluent speech  Labs:  None  Radiologic Studies:  None  Assessment: Encounter Diagnoses  Name Primary?  . Gastroesophageal reflux disease, esophagitis presence not specified Yes  . Esophageal dysphagia     Many years of reflux symptoms that recur despite therapy.  She now has dysphagia, which raises question of eosinophilic esophagitis.  Plan:  Upper endoscopy with possible dilation.  She is agreeable after discussion of procedure and risks. Depending on results, may consider dedicated pH and motility study and possibly offer TIF or fundoplication if appropriate.  Thank you for the courtesy of this consult.  Please call me with any questions or concerns.  Nelida Meuse III  CC: Referring provider noted above

## 2018-09-01 NOTE — Patient Instructions (Signed)
If you are age 34 or older, your body mass index should be between 23-30. Your Body mass index is 28.44 kg/m. If this is out of the aforementioned range listed, please consider follow up with your Primary Care Provider.  If you are age 15 or younger, your body mass index should be between 19-25. Your Body mass index is 28.44 kg/m. If this is out of the aformentioned range listed, please consider follow up with your Primary Care Provider.   You have been scheduled for an endoscopy. Please follow written instructions given to you at your visit today. If you use inhalers (even only as needed), please bring them with you on the day of your procedure. Your physician has requested that you go to www.startemmi.com and enter the access code given to you at your visit today. This web site gives a general overview about your procedure. However, you should still follow specific instructions given to you by our office regarding your preparation for the procedure.  It was a pleasure to see you today!  Dr. Loletha Carrow

## 2018-09-07 DIAGNOSIS — B9689 Other specified bacterial agents as the cause of diseases classified elsewhere: Secondary | ICD-10-CM | POA: Diagnosis not present

## 2018-09-07 DIAGNOSIS — J019 Acute sinusitis, unspecified: Secondary | ICD-10-CM | POA: Diagnosis not present

## 2018-09-10 ENCOUNTER — Encounter: Payer: Self-pay | Admitting: Gastroenterology

## 2018-09-13 ENCOUNTER — Encounter: Payer: Self-pay | Admitting: Family Medicine

## 2018-09-13 ENCOUNTER — Ambulatory Visit (INDEPENDENT_AMBULATORY_CARE_PROVIDER_SITE_OTHER): Payer: BLUE CROSS/BLUE SHIELD | Admitting: Family Medicine

## 2018-09-13 ENCOUNTER — Other Ambulatory Visit: Payer: Self-pay

## 2018-09-13 VITALS — BP 108/80 | HR 105 | Temp 98.4°F | Ht 61.0 in | Wt 150.8 lb

## 2018-09-13 DIAGNOSIS — E663 Overweight: Secondary | ICD-10-CM

## 2018-09-13 DIAGNOSIS — Z23 Encounter for immunization: Secondary | ICD-10-CM | POA: Diagnosis not present

## 2018-09-13 DIAGNOSIS — Z8639 Personal history of other endocrine, nutritional and metabolic disease: Secondary | ICD-10-CM | POA: Diagnosis not present

## 2018-09-13 DIAGNOSIS — E785 Hyperlipidemia, unspecified: Secondary | ICD-10-CM | POA: Diagnosis not present

## 2018-09-13 DIAGNOSIS — Z0000 Encounter for general adult medical examination without abnormal findings: Secondary | ICD-10-CM

## 2018-09-13 DIAGNOSIS — R4586 Emotional lability: Secondary | ICD-10-CM

## 2018-09-13 NOTE — Progress Notes (Signed)
Date of Visit: 09/13/2018   HPI:  Patient presents today for a well woman exam.   Concerns today: see below Contraception: norethindrone. Sees GYN for all GYn issues.  Pap smear status: current - last done 07/22/17 at GYN office, records available Exercise: no Diet: eats foods out of convenience, working on eating healthier. Wants to see nutritonist. Smoking: no Alcohol: no Drugs: no Mood: has felt down more lately, and anxious. No SI/HI. Has call into [employer] at work for counseling. Does not want to start medications at this time. Dentist: yes  Has upcoming endoscopy scheduled for dysphagia. Currently on doxycycline for sinusitis (prescribed at urgent care), getting better. Plans to see neuro in follow up for migraines.  Endorses palpitations a few times over the last few months. Previously had palpitations when she was in high school, wore a heart monitor. Had abnormal thyroid function testing at that time but never had to go on medication. Would like her thyroid checked now. Patient reports palpitations do not bother her enough to do much more workup than thyroid labs.   ROS: See HPI  [place]:  Cancers in family: none  PHYSICAL EXAM: BP 108/80   Pulse (!) 105   Temp 98.4 F (36.9 C) (Oral)   Ht 5\' 1"  (1.549 m)   Wt 150 lb 12.8 oz (68.4 kg)   LMP 08/20/2018   SpO2 97%   BMI 28.49 kg/m  Gen: NAD, pleasant, cooperative HEENT: NCAT, PERRL, no anterior cervical lymphadenopathy. Does have possible slightly enlarged thyroid/neck fullness. Heart: RRR, no murmurs Lungs: CTAB, NWOB Abdomen: soft, nontender to palpation Neuro: grossly nonfocal, speech normal  ASSESSMENT/PLAN:  Health maintenance:  -pap smear: current, sees GYN -lipid screening: advised to schedule fasting lab appointment  -immunizations: flu shot given today -handout given on health maintenance topics  History of thyroid disorder Exact nature of her thyroid disorder unclear. Has subclinical hyperthyroidism  listed, but patient reports she thought her thyroid was hypoactive. Check TSH, T3, free T4, TPO antibodies.  Overweight Gave phone # for Dr. Jenne Campus, so patient can schedule.  Mood change No SI/HI. Under lots of stress lately with work and family. Encouraged patient to seek counseling, as she is already doing. Discussed broad option of medication, but patient prefers counseling at this time. She will let me know if she wants to pursue medication in the future.   FOLLOW UP: Follow up in 1 year for next CPE  [place] J. Ardelia Mems, [place]

## 2018-09-13 NOTE — Patient Instructions (Signed)
For weight management: Call Dr. Jenne Campus (our nutritionist) to set up an appointment. Her phone number is: [phone].   On your way out, schedule an appointment one morning to come back for fasting labs. Do not eat or drink anything other than water the morning of your lab appointment until after your labs are drawn.  Let me know how things are going and if you think medication would be helpful.  Otherwise follow up in 1 year, sooner if needed.  Be well, Dr. Ardelia Mems   Health Maintenance, Female Adopting a healthy lifestyle and getting preventive care can go a long way to promote health and wellness. Talk with your health care provider about what schedule of regular examinations is right for you. This is a good chance for you to check in with your provider about disease prevention and staying healthy. In between checkups, there are plenty of things you can do on your own. Experts have done a lot of research about which lifestyle changes and preventive measures are most likely to keep you healthy. Ask your health care provider for more information. Weight and diet Eat a healthy diet  Be sure to include plenty of vegetables, fruits, low-fat dairy products, and lean protein.  Do not eat a lot of foods high in solid fats, added sugars, or salt.  Get regular exercise. This is one of the most important things you can do for your health. ? Most adults should exercise for at least 150 minutes each week. The exercise should increase your heart rate and make you sweat (moderate-intensity exercise). ? Most adults should also do strengthening exercises at least twice a week. This is in addition to the moderate-intensity exercise. Maintain a healthy weight  Body mass index (BMI) is a measurement that can be used to identify possible weight problems. It estimates body fat based on height and weight. Your health care provider can help determine your BMI and help you achieve or maintain a healthy  weight.  For females 64 years of age and older: ? A BMI below 18.5 is considered underweight. ? A BMI of 18.5 to 24.9 is normal. ? A BMI of 25 to 29.9 is considered overweight. ? A BMI of 30 and above is considered obese. Watch levels of cholesterol and blood lipids  You should start having your blood tested for lipids and cholesterol at 34 years of age, then have this test every 5 years.  You may need to have your cholesterol levels checked more often if: ? Your lipid or cholesterol levels are high. ? You are older than 34 years of age. ? You are at high risk for heart disease. Cancer screening Lung Cancer  Lung cancer screening is recommended for adults 35-43 years old who are at high risk for lung cancer because of a history of smoking.  A yearly low-dose CT scan of the lungs is recommended for people who: ? Currently smoke. ? Have quit within the past 15 years. ? Have at least a 30-pack-year history of smoking. A pack year is smoking an average of one pack of cigarettes a day for 1 year.  Yearly screening should continue until it has been 15 years since you quit.  Yearly screening should stop if you develop a health problem that would prevent you from having lung cancer treatment. Breast Cancer  Practice breast self-awareness. This means understanding how your breasts normally appear and feel.  It also means doing regular breast self-exams. Let your health care provider know about  any changes, no matter how small.  If you are in your 20s or 30s, you should have a clinical breast exam (CBE) by a health care provider every 1-3 years as part of a regular health exam.  If you are 72 or older, have a CBE every year. Also consider having a breast X-ray (mammogram) every year.  If you have a family history of breast cancer, talk to your health care provider about genetic screening.  If you are at high risk for breast cancer, talk to your health care provider about having an MRI  and a mammogram every year.  Breast cancer gene (BRCA) assessment is recommended for women who have family members with BRCA-related cancers. BRCA-related cancers include: ? Breast. ? Ovarian. ? Tubal. ? Peritoneal cancers.  Results of the assessment will determine the need for genetic counseling and BRCA1 and BRCA2 testing. Cervical Cancer Your health care provider may recommend that you be screened regularly for cancer of the pelvic organs (ovaries, uterus, and vagina). This screening involves a pelvic examination, including checking for microscopic changes to the surface of your cervix (Pap test). You may be encouraged to have this screening done every 3 years, beginning at age 64.  For women ages 46-65, health care providers may recommend pelvic exams and Pap testing every 3 years, or they may recommend the Pap and pelvic exam, combined with testing for human papilloma virus (HPV), every 5 years. Some types of HPV increase your risk of cervical cancer. Testing for HPV may also be done on women of any age with unclear Pap test results.  Other health care providers may not recommend any screening for nonpregnant women who are considered low risk for pelvic cancer and who do not have symptoms. Ask your health care provider if a screening pelvic exam is right for you.  If you have had past treatment for cervical cancer or a condition that could lead to cancer, you need Pap tests and screening for cancer for at least 20 years after your treatment. If Pap tests have been discontinued, your risk factors (such as having a new sexual partner) need to be reassessed to determine if screening should resume. Some women have medical problems that increase the chance of getting cervical cancer. In these cases, your health care provider may recommend more frequent screening and Pap tests. Colorectal Cancer  This type of cancer can be detected and often prevented.  Routine colorectal cancer screening usually  begins at 34 years of age and continues through 34 years of age.  Your health care provider may recommend screening at an earlier age if you have risk factors for colon cancer.  Your health care provider may also recommend using home test kits to check for hidden blood in the stool.  A small camera at the end of a tube can be used to examine your colon directly (sigmoidoscopy or colonoscopy). This is done to check for the earliest forms of colorectal cancer.  Routine screening usually begins at age 30.  Direct examination of the colon should be repeated every 5-10 years through 34 years of age. However, you may need to be screened more often if early forms of precancerous polyps or small growths are found. Skin Cancer  Check your skin from head to toe regularly.  Tell your health care provider about any new moles or changes in moles, especially if there is a change in a mole's shape or color.  Also tell your health care provider if you have  a mole that is larger than the size of a pencil eraser.  Always use sunscreen. Apply sunscreen liberally and repeatedly throughout the day.  Protect yourself by wearing long sleeves, pants, a wide-brimmed hat, and sunglasses whenever you are outside. Heart disease, diabetes, and high blood pressure  High blood pressure causes heart disease and increases the risk of stroke. High blood pressure is more likely to develop in: ? People who have blood pressure in the high end of the normal range (130-139/85-89 mm Hg). ? People who are overweight or obese. ? People who are African American.  If you are 78-59 years of age, have your blood pressure checked every 3-5 years. If you are 66 years of age or older, have your blood pressure checked every year. You should have your blood pressure measured twice-once when you are at a hospital or clinic, and once when you are not at a hospital or clinic. Record the average of the two measurements. To check your blood  pressure when you are not at a hospital or clinic, you can use: ? An automated blood pressure machine at a pharmacy. ? A home blood pressure monitor.  If you are between 44 years and 51 years old, ask your health care provider if you should take aspirin to prevent strokes.  Have regular diabetes screenings. This involves taking a blood sample to check your fasting blood sugar level. ? If you are at a normal weight and have a low risk for diabetes, have this test once every three years after 34 years of age. ? If you are overweight and have a high risk for diabetes, consider being tested at a younger age or more often. Preventing infection Hepatitis B  If you have a higher risk for hepatitis B, you should be screened for this virus. You are considered at high risk for hepatitis B if: ? You were born in a country where hepatitis B is common. Ask your health care provider which countries are considered high risk. ? Your parents were born in a high-risk country, and you have not been immunized against hepatitis B (hepatitis B vaccine). ? You have HIV or AIDS. ? You use needles to inject street drugs. ? You live with someone who has hepatitis B. ? You have had sex with someone who has hepatitis B. ? You get hemodialysis treatment. ? You take certain medicines for conditions, including cancer, organ transplantation, and autoimmune conditions. Hepatitis C  Blood testing is recommended for: ? Everyone born from [birth year] through 1966. ? Anyone with known risk factors for hepatitis C. Sexually transmitted infections (STIs)  You should be screened for sexually transmitted infections (STIs) including gonorrhea and chlamydia if: ? You are sexually active and are younger than 34 years of age. ? You are older than 34 years of age and your health care provider tells you that you are at risk for this type of infection. ? Your sexual activity has changed since you were last screened and you are at an  increased risk for chlamydia or gonorrhea. Ask your health care provider if you are at risk.  If you do not have HIV, but are at risk, it may be recommended that you take a prescription medicine daily to prevent HIV infection. This is called pre-exposure prophylaxis (PrEP). You are considered at risk if: ? You are sexually active and do not regularly use condoms or know the HIV status of your partner(s). ? You take drugs by injection. ? You are sexually  active with a partner who has HIV. Talk with your health care provider about whether you are at high risk of being infected with HIV. If you choose to begin PrEP, you should first be tested for HIV. You should then be tested every 3 months for as long as you are taking PrEP. Pregnancy  If you are premenopausal and you may become pregnant, ask your health care provider about preconception counseling.  If you may become pregnant, take 400 to 800 micrograms (mcg) of folic acid every day.  If you want to prevent pregnancy, talk to your health care provider about birth control (contraception). Osteoporosis and menopause  Osteoporosis is a disease in which the bones lose minerals and strength with aging. This can result in serious bone fractures. Your risk for osteoporosis can be identified using a bone density scan.  If you are 46 years of age or older, or if you are at risk for osteoporosis and fractures, ask your health care provider if you should be screened.  Ask your health care provider whether you should take a calcium or vitamin D supplement to lower your risk for osteoporosis.  Menopause may have certain physical symptoms and risks.  Hormone replacement therapy may reduce some of these symptoms and risks. Talk to your health care provider about whether hormone replacement therapy is right for you. Follow these instructions at home:  Schedule regular health, dental, and eye exams.  Stay current with your immunizations.  Do not use  any tobacco products including cigarettes, chewing tobacco, or electronic cigarettes.  If you are pregnant, do not drink alcohol.  If you are breastfeeding, limit how much and how often you drink alcohol.  Limit alcohol intake to no more than 1 drink per day for nonpregnant women. One drink equals 12 ounces of beer, 5 ounces of wine, or 1 ounces of hard liquor.  Do not use street drugs.  Do not share needles.  Ask your health care provider for help if you need support or information about quitting drugs.  Tell your health care provider if you often feel depressed.  Tell your health care provider if you have ever been abused or do not feel safe at home. This information is not intended to replace advice given to you by your health care provider. Make sure you discuss any questions you have with your health care provider. Document Released: 02/03/2011 Document Revised: 12/27/2015 Document Reviewed: 04/24/2015 Elsevier Interactive Patient Education  2019 [employer].

## 2018-09-14 ENCOUNTER — Other Ambulatory Visit: Payer: BLUE CROSS/BLUE SHIELD

## 2018-09-14 DIAGNOSIS — E785 Hyperlipidemia, unspecified: Secondary | ICD-10-CM | POA: Diagnosis not present

## 2018-09-14 DIAGNOSIS — Z8639 Personal history of other endocrine, nutritional and metabolic disease: Secondary | ICD-10-CM | POA: Diagnosis not present

## 2018-09-15 DIAGNOSIS — R4586 Emotional lability: Secondary | ICD-10-CM | POA: Insufficient documentation

## 2018-09-15 DIAGNOSIS — Z8639 Personal history of other endocrine, nutritional and metabolic disease: Secondary | ICD-10-CM | POA: Insufficient documentation

## 2018-09-15 LAB — CMP14+EGFR
ALT: 23 IU/L (ref 0–32)
AST: 18 IU/L (ref 0–40)
Albumin/Globulin Ratio: 2 (ref 1.2–2.2)
Albumin: 4.4 g/dL (ref 3.8–4.8)
Alkaline Phosphatase: 76 IU/L (ref 39–117)
BUN/Creatinine Ratio: 15 (ref 9–23)
BUN: 10 mg/dL (ref 6–20)
Bilirubin Total: 0.5 mg/dL (ref 0.0–1.2)
CO2: 20 mmol/L (ref 20–29)
Calcium: 9 mg/dL (ref 8.7–10.2)
Chloride: 103 mmol/L (ref 96–106)
Creatinine, Ser: 0.68 mg/dL (ref 0.57–1.00)
GFR calc Af Amer: 133 mL/min/{1.73_m2} (ref >59)
GFR calc non Af Amer: 115 mL/min/{1.73_m2} (ref >59)
Globulin, Total: 2.2 g/dL (ref 1.5–4.5)
Glucose: 82 mg/dL (ref 65–99)
Potassium: 4 mmol/L (ref 3.5–5.2)
Sodium: 141 mmol/L (ref 134–144)
Total Protein: 6.6 g/dL (ref 6.0–8.5)

## 2018-09-15 LAB — LIPID PANEL
Chol/HDL Ratio: 4.3 ratio (ref 0.0–4.4)
Cholesterol, Total: 141 mg/dL (ref 100–199)
HDL: 33 mg/dL — ABNORMAL LOW (ref >39)
LDL Calculated: 79 mg/dL (ref 0–99)
Triglycerides: 146 mg/dL (ref 0–149)
VLDL Cholesterol Cal: 29 mg/dL (ref 5–40)

## 2018-09-15 LAB — THYROID PEROXIDASE ANTIBODY

## 2018-09-15 LAB — T3: T3, Total: 135 ng/dL (ref 71–180)

## 2018-09-15 LAB — TSH: TSH: 1.59 u[IU]/mL (ref 0.450–4.500)

## 2018-09-15 LAB — T4, FREE: Free T4: 1.5 ng/dL (ref 0.82–1.77)

## 2018-09-15 NOTE — Assessment & Plan Note (Signed)
Gave phone # for Dr. Jenne Campus, so patient can schedule.

## 2018-09-15 NOTE — Assessment & Plan Note (Signed)
No SI/HI. Under lots of stress lately with work and family. Encouraged patient to seek counseling, as she is already doing. Discussed broad option of medication, but patient prefers counseling at this time. She will let me know if she wants to pursue medication in the future.

## 2018-09-15 NOTE — Assessment & Plan Note (Signed)
Exact nature of her thyroid disorder unclear. Has subclinical hyperthyroidism listed, but patient reports she thought her thyroid was hypoactive. Check TSH, T3, free T4, TPO antibodies.

## 2018-09-17 ENCOUNTER — Other Ambulatory Visit: Payer: Self-pay | Admitting: Family Medicine

## 2018-09-17 DIAGNOSIS — R7989 Other specified abnormal findings of blood chemistry: Secondary | ICD-10-CM

## 2018-09-24 ENCOUNTER — Other Ambulatory Visit: Payer: Self-pay

## 2018-09-24 ENCOUNTER — Encounter: Payer: Self-pay | Admitting: Gastroenterology

## 2018-09-24 ENCOUNTER — Ambulatory Visit (AMBULATORY_SURGERY_CENTER): Payer: BLUE CROSS/BLUE SHIELD | Admitting: Gastroenterology

## 2018-09-24 ENCOUNTER — Telehealth: Payer: Self-pay | Admitting: Gastroenterology

## 2018-09-24 VITALS — BP 120/71 | HR 71 | Temp 97.3°F | Resp 17 | Ht 61.0 in | Wt 150.0 lb

## 2018-09-24 DIAGNOSIS — K449 Diaphragmatic hernia without obstruction or gangrene: Secondary | ICD-10-CM | POA: Diagnosis not present

## 2018-09-24 DIAGNOSIS — K219 Gastro-esophageal reflux disease without esophagitis: Secondary | ICD-10-CM

## 2018-09-24 DIAGNOSIS — R1310 Dysphagia, unspecified: Secondary | ICD-10-CM

## 2018-09-24 DIAGNOSIS — K222 Esophageal obstruction: Secondary | ICD-10-CM | POA: Diagnosis not present

## 2018-09-24 DIAGNOSIS — R1319 Other dysphagia: Secondary | ICD-10-CM

## 2018-09-24 MED ORDER — SODIUM CHLORIDE 0.9 % IV SOLN: 500.0000 mL | Freq: Once | INTRAVENOUS | Status: DC

## 2018-09-24 NOTE — Op Note (Signed)
[facility] Patient Name: Sherry Shepherd Procedure Date: 09/24/2018 10:47 AM MRN: [id number] Endoscopist: [place]. Loletha Carrow , MD Age: 33 Referring MD:  Date of Birth: 07/09/1986 Gender: Female Account #: [id number] Procedure:                Upper GI endoscopy Indications:              Dysphagia, Esophageal reflux symptoms that recur                            despite appropriate therapy (>10 years symptoms                            improve when on acid suppression therapy) Medicines:                Monitored Anesthesia Care Procedure:                Pre-Anesthesia Assessment:                           - Prior to the procedure, a History and Physical                            was performed, and patient medications and                            allergies were reviewed. The patient's tolerance of                            previous anesthesia was also reviewed. The risks                            and benefits of the procedure and the sedation                            options and risks were discussed with the patient.                            All questions were answered, and informed consent                            was obtained. Prior Anticoagulants: The patient has                            taken no previous anticoagulant or antiplatelet                            agents. ASA Grade Assessment: I - A normal, healthy                            patient. After reviewing the risks and benefits,                            the patient was deemed in satisfactory condition to  undergo the procedure.                           After obtaining informed consent, the endoscope was                            passed under direct vision. Throughout the                            procedure, the patient's blood pressure, pulse, and                            oxygen saturations were monitored continuously. The                            Endoscope was introduced  through the mouth, and                            advanced to the second part of duodenum. The upper                            GI endoscopy was accomplished without difficulty.                            The patient tolerated the procedure well. Scope In: Scope Out: Findings:                 A widely patent Schatzki ring was found at the                            gastroesophageal junction.                           A 1-2 cm sliding hiatal hernia was present.                           There is no endoscopic evidence of Barrett's                            esophagus, esophagitis or mucosal abnormalities in                            the entire esophagus.                           The stomach was normal.                           The cardia and gastric fundus were normal on                            retroflexion.                           The examined duodenum was normal. Complications:            No immediate complications.  Estimated Blood Loss:     Estimated blood loss: none. Impression:               - Widely patent Schatzki ring.                           - 2 cm hiatal hernia.                           - Normal stomach.                           - Normal examined duodenum.                           - No specimens collected. Recommendation:           - Patient has a contact number available for                            emergencies. The signs and symptoms of potential                            delayed complications were discussed with the                            patient. Return to normal activities tomorrow.                            Written discharge instructions were provided to the                            patient.                           - Resume previous diet.                           - Continue present medications.                           - Follow an antireflux regimen indefinitely.                           - Perform a barium esophagram at appointment to be                             scheduled. Then office follow-up will be arranged                            to discuss long term treatment options (meds vs TIF                            or fundoplication) Mallie Mussel L. Loletha Carrow, MD 09/24/2018 11:07:07 AM This report has been signed electronically.

## 2018-09-24 NOTE — Telephone Encounter (Signed)
Noted  

## 2018-09-24 NOTE — Patient Instructions (Addendum)
No pathology taken today. Follow GERD protocol (hiatal hernia) indefinitely. Esophageal ring known as Schatzki's ring  Good 1st meal: Eggs Grits Toast Pancakes/waffles Lean meat  Absolutely no driving today.   YOU HAD AN ENDOSCOPIC PROCEDURE TODAY AT [facility]:   Refer to the procedure report that was given to you for any specific questions about what was found during the examination.  If the procedure report does not answer your questions, please call your gastroenterologist to clarify.  If you requested that your care partner not be given the details of your procedure findings, then the procedure report has been included in a sealed envelope for you to review at your convenience later.  YOU SHOULD EXPECT: Some feelings of bloating in the abdomen. Passage of more gas than usual.  Walking can help get rid of the air that was put into your GI tract during the procedure and reduce the bloating. If you had a lower endoscopy (such as a colonoscopy or flexible sigmoidoscopy) you may notice spotting of blood in your stool or on the toilet paper. If you underwent a bowel prep for your procedure, you may not have a normal bowel movement for a few days.  Please Note:  You might notice some irritation and congestion in your nose or some drainage.  This is from the oxygen used during your procedure.  There is no need for concern and it should clear up in a day or so.  SYMPTOMS TO REPORT IMMEDIATELY:    Following upper endoscopy (EGD)  Vomiting of blood or coffee ground material  New chest pain or pain under the shoulder blades  Painful or persistently difficult swallowing  New shortness of breath  Fever of 100F or higher  Black, tarry-looking stools  For urgent or emergent issues, a gastroenterologist can be reached at any hour by calling [phone].   DIET:  We do recommend a small meal at first, but then you may proceed to your regular diet.  Drink plenty of fluids  but you should avoid alcoholic beverages for 24 hours.  ACTIVITY:  You should plan to take it easy for the rest of today and you should NOT DRIVE or use heavy machinery until tomorrow (because of the sedation medicines used during the test).    FOLLOW UP: Our staff will call the number listed on your records the next business day following your procedure to check on you and address any questions or concerns that you may have regarding the information given to you following your procedure. If we do not reach you, we will leave a message.  However, if you are feeling well and you are not experiencing any problems, there is no need to return our call.  We will assume that you have returned to your regular daily activities without incident.  If any biopsies were taken you will be contacted by phone or by letter within the next 1-3 weeks.  Please call us at [phone] if you have not heard about the biopsies in 3 weeks.    SIGNATURES/CONFIDENTIALITY: You and/or your care partner have signed paperwork which will be entered into your electronic medical record.  These signatures attest to the fact that that the information above on your After Visit Summary has been reviewed and is understood.  Full responsibility of the confidentiality of this discharge information lies with you and/or your care-partner.

## 2018-09-24 NOTE — Telephone Encounter (Signed)
Please schedule a barium esophagram for this patient.  Diagnosis: GERD and dysphagia

## 2018-09-24 NOTE — Telephone Encounter (Signed)
Pt has been scheduled for 10-12-2018 @ 43 am. Letter has been mailed to patients home address on file.

## 2018-09-24 NOTE — Progress Notes (Signed)
Pt awake. VSS. Report given to RN. No anesthetic complications noted 

## 2018-09-27 ENCOUNTER — Telehealth: Payer: Self-pay | Admitting: *Deleted

## 2018-09-27 ENCOUNTER — Other Ambulatory Visit: Payer: Self-pay

## 2018-09-27 ENCOUNTER — Telehealth: Payer: Self-pay

## 2018-09-27 DIAGNOSIS — R1310 Dysphagia, unspecified: Secondary | ICD-10-CM

## 2018-09-27 NOTE — Telephone Encounter (Signed)
  Follow up Call-  Call back number 09/24/2018  Post procedure Call Back phone  # [phone]  Permission to leave phone message Yes  Some recent data might be hidden     Patient questions:  Do you have a fever, pain , or abdominal swelling? No. Pain Score  0 *  Have you tolerated food without any problems? Yes.    Have you been able to return to your normal activities? Yes.    Do you have any questions about your discharge instructions: Diet   No. Medications  No. Follow up visit  No.  Do you have questions or concerns about your Care? No.  Actions: * If pain score is 4 or above: No action needed, pain <4.

## 2018-09-27 NOTE — Telephone Encounter (Signed)
First attempt, left VM.  

## 2018-10-01 ENCOUNTER — Other Ambulatory Visit: Payer: Self-pay

## 2018-10-01 ENCOUNTER — Encounter: Payer: Self-pay | Admitting: Internal Medicine

## 2018-10-01 ENCOUNTER — Ambulatory Visit (INDEPENDENT_AMBULATORY_CARE_PROVIDER_SITE_OTHER): Payer: BLUE CROSS/BLUE SHIELD | Admitting: Internal Medicine

## 2018-10-01 VITALS — BP 114/78 | HR 96 | Ht 61.0 in | Wt 151.2 lb

## 2018-10-01 DIAGNOSIS — E063 Autoimmune thyroiditis: Secondary | ICD-10-CM | POA: Diagnosis not present

## 2018-10-01 NOTE — Progress Notes (Signed)
Name: Kyliegh Jester  MRN/ DOB: [id number], [birth year]    Age/ Sex: 33 y.o., female    PCP: Leeanne Rio, MD   Reason for Endocrinology Evaluation: Hashimoto's Disease     Date of Initial Endocrinology Evaluation: 10/01/2018     HPI: Ms. Jessica Checketts is a 33 y.o. female with a past medical history of Migraine headaches and GERD. The patient presented for initial endocrinology clinic visit on 10/01/2018 for consultative assistance with her Hashimoto's disease.   At the age of 32 she was having issues with palpitations and anxiety, she was noted to have a goiter at the time . She saw an endocrinologist at the time and was told she has hyperthyroidism , no treatment was offered at the time and was advised to monitor it.  Pt was lost to follow up until recently she noted symptoms of palpitations, anxiety and fatigue  For the past 6 weeks.    She is on [place] for contraception.  Has not noted any local neck symptoms.   No previous exposure to radiation   No biotin or kelp use.     HISTORY:  Past Medical History:  Past Medical History:  Diagnosis Date  . Allergy   . GERD (gastroesophageal reflux disease)   . Gestational diabetes   . Hx of varicella   . Lichen sclerosus of female genitalia   . Migraine    Past Surgical History:  Past Surgical History:  Procedure Laterality Date  . DENTAL SURGERY        Social History:  reports that she has never smoked. She has never used smokeless tobacco. She reports that she does not drink alcohol or use drugs.  Family History: family history includes Birth defects in her brother; Congestive Heart Failure in her paternal grandfather; Diabetes in her father and mother; Hyperlipidemia in her father; Macular degeneration in her mother.   HOME MEDICATIONS: Allergies as of 10/01/2018      Reactions   Amoxicillin Hives   Cefprozil Hives      Medication List       Accurate as of October 01, 2018 11:05 AM. Always use your most  recent med list.        clobetasol ointment 0.05 % Commonly known as:  TEMOVATE Apply 1 application topically as needed.   fluticasone 50 MCG/ACT nasal spray Commonly known as:  FLONASE Place 2 sprays into both nostrils daily.   NORLYDA 0.35 MG tablet Generic drug:  norethindrone Take 1 tablet by mouth daily.   omeprazole 40 MG capsule Commonly known as:  PRILOSEC Take 1 capsule (40 mg total) by mouth daily.   rizatriptan 5 MG disintegrating tablet Commonly known as:  MAXALT-MLT Take 1 tablet (5 mg total) by mouth as needed. May repeat in 2 hours if needed         REVIEW OF SYSTEMS: A comprehensive ROS was conducted with the patient and is negative except as per HPI and below:  Review of Systems  Eyes: Negative for blurred vision and photophobia.  Respiratory: Negative for cough and shortness of breath.   Cardiovascular: Negative for chest pain and palpitations.  Gastrointestinal: Positive for constipation. Negative for nausea.  Genitourinary: Negative for frequency.  Neurological: Positive for headaches. Negative for tingling.  Endo/Heme/Allergies: Negative for polydipsia.  Psychiatric/Behavioral: Negative for depression. The patient is nervous/anxious.        OBJECTIVE:  VS: BP 114/78 (BP Location: Left Arm, Patient Position: Sitting, Cuff Size: Normal)   Pulse 96  Ht 5\' 1"  (1.549 m)   Wt 151 lb 3.2 oz (68.6 kg)   LMP 09/10/2018   SpO2 98%   BMI 28.57 kg/m    Wt Readings from Last 3 Encounters:  10/01/18 151 lb 3.2 oz (68.6 kg)  09/24/18 150 lb (68 kg)  09/13/18 150 lb 12.8 oz (68.4 kg)     EXAM: General: Pt appears well and is in NAD  Hydration: Well-hydrated with moist mucous membranes and good skin turgor  Eyes: External eye exam normal without stare, lid lag or exophthalmos.  EOM intact.  PERRL.  Ears, Nose, Throat: Hearing: Grossly intact bilaterally Dental: Good dentition  Throat: Clear without mass, erythema or exudate  Neck: General:  Supple without adenopathy. Thyroid: Thyroid gland is prominent. There's a questionable right inferior nodule. No thyroid bruit.  Lungs: Clear with good BS bilat with no rales, rhonchi, or wheezes  Heart: Auscultation: RRR.  Abdomen: Normoactive bowel sounds, soft, nontender, without masses or organomegaly palpable  Extremities:  BL LE: No pretibial edema normal ROM and strength.  Skin: Hair: Texture and amount normal with gender appropriate distribution Skin Inspection: No rashes. Skin Palpation: Skin temperature, texture, and thickness normal to palpation  Neuro: Cranial nerves: II - XII grossly intact  Motor: Normal strength throughout DTRs: 2+ and symmetric in UE without delay in relaxation phase  Mental Status: Judgment, insight: Intact Orientation: Oriented to time, place, and person Mood and affect: No depression, anxiety, or agitation     DATA REVIEWED: Results for Carriere, JUDINE (MRN [id number]) as of 10/01/2018 08:20  Ref. Range 09/14/2018 10:11 09/14/2018 10:50  TSH Latest Ref Range: 0.450 - 4.500 uIU/mL 1.590   Triiodothyronine (T3) Latest Ref Range: 71 - 180 ng/dL 135   T4,Free(Direct) Latest Ref Range: 0.82 - 1.77 ng/dL 1.50   Thyroperoxidase Ab SerPl-aCnc Latest Ref Range: 0 - 34 IU/mL  >600 (H)      ASSESSMENT/PLAN/RECOMMENDATIONS:   1. Hashimoto's disease:  -Patient with multiple nonspecific symptoms that are NOT attributed to her thyroid at this time -It seems like at the age of 31 she did have probably a subclinical hyperthyroidism, which is not unheard of in people with Hashimoto's disease, some people can have Hashi-Toxi , but regardless what has happened 15 years ago, at this time her thyroid is normal. - I explained to the patient that Hashimoto's Disease is an autoimmune - mediated destruction of the thyroid gland. The usual course of Hashimoto's thyroiditis is the gradual loss of thyroid function. Overt hypothyroidism occurs at a rate of ~ 5% per year.  - Will  order a thyroid ultrasound for the questionable right inferior nodule. It not uncommon for people with hashimoto's disease to have a large thyroid  - She will require annual TFT's or sooner if she is symptomatic, this can be done at our office or her  PCP's office, depending on her choice.    F/u in 6 months     Signed electronically by: Mack Guise, MD  [facility]  [address] [place], [place] [address] Phone: [phone] FAX: [phone]   CC: Leeanne Rio, [place] [address] Phone: [phone] Fax: [phone]   Return to Endocrinology clinic as below: Future Appointments  Date Time Provider [place]  10/12/2018  9:30 AM [facility] R/F 1 [facility] [place]  10/18/2018  2:00 PM Danis, Kirke Corin, MD [facility]

## 2018-10-04 ENCOUNTER — Encounter: Payer: Self-pay | Admitting: Family Medicine

## 2018-10-12 ENCOUNTER — Ambulatory Visit (HOSPITAL_COMMUNITY)
Admission: RE | Admit: 2018-10-12 | Discharge: 2018-10-12 | Disposition: A | Discharge status: Home / Self Care | Payer: BLUE CROSS/BLUE SHIELD | Source: Ambulatory Visit | Attending: Gastroenterology | Admitting: Gastroenterology

## 2018-10-12 DIAGNOSIS — R1310 Dysphagia, unspecified: Secondary | ICD-10-CM

## 2018-10-12 DIAGNOSIS — R1319 Other dysphagia: Secondary | ICD-10-CM

## 2018-10-12 DIAGNOSIS — K219 Gastro-esophageal reflux disease without esophagitis: Secondary | ICD-10-CM

## 2018-10-12 DIAGNOSIS — K449 Diaphragmatic hernia without obstruction or gangrene: Secondary | ICD-10-CM | POA: Diagnosis not present

## 2018-10-18 ENCOUNTER — Ambulatory Visit (INDEPENDENT_AMBULATORY_CARE_PROVIDER_SITE_OTHER): Payer: BLUE CROSS/BLUE SHIELD | Admitting: Gastroenterology

## 2018-10-18 ENCOUNTER — Other Ambulatory Visit: Payer: Self-pay

## 2018-10-18 ENCOUNTER — Encounter: Payer: Self-pay | Admitting: Gastroenterology

## 2018-10-18 VITALS — BP 116/64 | HR 73 | Ht 61.0 in | Wt 152.6 lb

## 2018-10-18 DIAGNOSIS — K219 Gastro-esophageal reflux disease without esophagitis: Secondary | ICD-10-CM

## 2018-10-18 MED ORDER — OMEPRAZOLE 20 MG PO CPDR: 20.0000 mg | DELAYED_RELEASE_CAPSULE | Freq: Every day | ORAL | 1 refills | Status: DC

## 2018-10-18 NOTE — Patient Instructions (Signed)
If you are age 34 or older, your body mass index should be between 23-30. Your Body mass index is 28.83 kg/m. If this is out of the aforementioned range listed, please consider follow up with your Primary Care Provider.  If you are age 50 or younger, your body mass index should be between 19-25. Your Body mass index is 28.83 kg/m. If this is out of the aformentioned range listed, please consider follow up with your Primary Care Provider.   To help prevent the possible spread of infection to our patients, communities, and staff; we will be implementing the following measures:  Please only allow one visitor/family member to accompany you to any upcoming appointments with [facility]. If you have any concerns about this please contact our office to discuss prior to the appointment.   Follow up as needed.   It was a pleasure to see you today!  Dr. Loletha Carrow

## 2018-10-18 NOTE — Progress Notes (Signed)
     [facility] Progress Note  Chief Complaint: GERD  Subjective  History:  Many years of GERD symptoms, generally under good control on low-dose PPI, diet and lifestyle measures. Recent upper endoscopy with small hiatal hernia, widely patent Schatzki ring, no esophagitis or other abnormalities. Barium esophagram last week was normal.    Sherry Shepherd is generally feeling well.  Her symptoms of regurgitation heartburn are under good control on omeprazole 40 mg daily as well as diet and lifestyle measures. Is interested in other long-term treatment options for GERD.  She is also considering another pregnancy in the next couple of years, and recalls that her GERD was significantly worsened during previous pregnancy. Dysphagia resolved on medicine. ROS: Cardiovascular:  no chest pain Respiratory: no dyspnea  The patient's Past Medical, Family and Social History were reviewed and are on file in the EMR.  Objective:  Med list reviewed  Current Outpatient Medications:  .  clobetasol ointment (TEMOVATE) 8.00 %, Apply 1 application topically as needed., Disp: , Rfl:  .  fluticasone (FLONASE) 50 MCG/ACT nasal spray, Place 2 sprays into both nostrils daily., Disp: , Rfl:  .  NORLYDA 0.35 MG tablet, Take 1 tablet by mouth daily., Disp: , Rfl:  .  rizatriptan (MAXALT-MLT) 5 MG disintegrating tablet, Take 1 tablet (5 mg total) by mouth as needed. May repeat in 2 hours if needed, Disp: 9 tablet, Rfl: 1 .  omeprazole (PRILOSEC) 20 MG capsule, Take 1 capsule (20 mg total) by mouth daily., Disp: 30 capsule, Rfl: 1   Vital signs in last 24 hrs: Vitals:   10/18/18 1408  BP: 116/64  Pulse: 73    Physical Exam   He is well-appearing with normal vocal quality.  Exam today.  20-minute visit, all of which spent face-to-face counseling and coordinating care.   @ASSESSMENTPLANBEGIN @ Assessment: Encounter Diagnosis  Name Primary?  . Gastroesophageal reflux disease, esophagitis presence not  specified Yes   She has good symptom control on current regimen, and we discussed options for possible TIF or fundoplication.  Since we have not demonstrated reflux on testing so far, she would need formal pH testing to see if she is a candidate for either of those.  Both were described in detail.  She is inclined to pursue that, but would like to give it further consideration.  I also encouraged her to try a lower dose of omeprazole to see where the dosing threshold is. Plan: Omeprazole 20 mg once daily.  We can go back up to 40 mg if necessary. See me as needed.  Nelida Meuse III

## 2018-10-26 ENCOUNTER — Ambulatory Visit
Admission: RE | Admit: 2018-10-26 | Discharge: 2018-10-26 | Disposition: A | Discharge status: Home / Self Care | Payer: BLUE CROSS/BLUE SHIELD | Source: Ambulatory Visit | Attending: Internal Medicine | Admitting: Internal Medicine

## 2018-10-26 ENCOUNTER — Other Ambulatory Visit: Payer: Self-pay

## 2018-10-26 DIAGNOSIS — E063 Autoimmune thyroiditis: Secondary | ICD-10-CM | POA: Diagnosis not present

## 2018-12-08 ENCOUNTER — Telehealth: Payer: Self-pay | Admitting: Gastroenterology

## 2018-12-08 MED ORDER — OMEPRAZOLE 40 MG PO CPDR: 40.0000 mg | DELAYED_RELEASE_CAPSULE | Freq: Every day | ORAL | 2 refills | Status: DC

## 2018-12-08 NOTE — Telephone Encounter (Signed)
Pt would like to go back up on her omeprazole dose. Ok to go back up to 40mg  a day and can we send in a new script?

## 2018-12-08 NOTE — Telephone Encounter (Signed)
I sent a new Rx for that dose.

## 2018-12-23 ENCOUNTER — Other Ambulatory Visit: Payer: Self-pay

## 2018-12-23 ENCOUNTER — Ambulatory Visit (INDEPENDENT_AMBULATORY_CARE_PROVIDER_SITE_OTHER): Payer: BLUE CROSS/BLUE SHIELD | Admitting: Family Medicine

## 2018-12-23 VITALS — BP 100/70 | HR 90

## 2018-12-23 DIAGNOSIS — L989 Disorder of the skin and subcutaneous tissue, unspecified: Secondary | ICD-10-CM

## 2018-12-23 DIAGNOSIS — D2262 Melanocytic nevi of left upper limb, including shoulder: Secondary | ICD-10-CM | POA: Diagnosis not present

## 2018-12-23 DIAGNOSIS — D225 Melanocytic nevi of trunk: Secondary | ICD-10-CM

## 2018-12-23 NOTE — Patient Instructions (Signed)
You were seen today due to concerns for the change in your mole.  We performed a shave biopsy and sent this to pathology.  I will let you know the results.  Additionally have given you wound care instructions separately.  Please come back at any time for biopsy of without additional mole if wish to do so, or if this area becomes erythematous, with drainage, or poor healing.

## 2018-12-23 NOTE — Progress Notes (Signed)
Subjective:    Patient ID: Sherry Shepherd, female    DOB: 1986-07-11, 33 y.o.   MRN: [id number]   CC: "concern about mole"   HPI: [name] is a 33 yo female presenting to discuss the following:   Concerning Nevi: States approximately 1 week ago, mole on her anterior chest became quite itchy.  Noted it started to peel and became crusted like a scab, then endorses that half of it seemed to fall off.  Feels like it is a lighter color than previously, previous dark brown. Also thinks the mole may have enlarged recently. No longer itching or painful.  Mole in the region of her bra strap and often gets rubbed by the strap. No new soaps, lotions, or sun tanning, however did use tanning beds in her teenage years.  Denies any associated fever, rash, erythema/warmth to touch, or drainage in the region.  No personal history of skin cancer, has had several moles removed in the past (due to irritating at her bra line) that were benign per pathology.  Family history of skin cancer, mostly BCC/no melanoma in her mother, aunt, and numerous cousins.  She is concerned due to the symptoms and would like it evaluated.  She would be interested in a biopsy today.   Smoking status reviewed  Review of Systems Per HPI, also denies recent illness, fever, headache, changes in vision, chest pain, shortness of breath, abdominal pain, N/V/D, weakness   Patient Active Problem List   Diagnosis Date Noted   Melanocytic nevi of trunk 12/24/2018   History of thyroid disorder 09/15/2018   Mood change 09/15/2018   Gastroesophageal reflux disease 07/26/2018   Dysphagia 07/26/2018   Multiple nevi 07/26/2018   Migraine headache 05/28/2016   Headache 05/13/2016   Overweight 10/07/2011   Fatigue 10/07/2011   HYPERTHYROIDISM, SUBCLINICAL 03/23/2009     Objective:  BP 100/70    Pulse 90    LMP 12/06/2018 (Approximate)    SpO2 100%  Vitals and nursing note reviewed  General: NAD, pleasant Cardiac: RRR, normal  heart sounds, no murmurs Respiratory: CTAB, normal effort Extremities: no edema or cyanosis. WWP. Skin: warm and dry, no rashes noted. Small ~ 75mm slightly raised, round, sharply demarcated  melanocytic nevus on anterior (at midclavicular line) left chest around 3rd intercostal space. Even pigmentation of area. Similar in appearance to other nevi on extremities. No tenderness to palpation. No surrounding swelling or erythema. Additional benign appearing nevi in right axilla (~ 4-8mm, round raised melanocyotic nevus)  Neuro: alert and oriented, no focal deficits Psych: normal affect  Unfortunately took picture of nevi, but ended up not being saved.   Procedure Note: Shave Biopsy of melanocytic nevus  Risks and benefits were discussed. Written consent was obtained.  Area cleaned with sterile EtOh wipe. 0.5cc of 1% Xylocaine with epi was injected at the base of the lesion using a 1.5 inch 25 cc needle.  Area was prepped in a sterile fashion.  Pick ups were used to elevate the lesion and shave excision was completed using a dermablade Hemostasis achieved with pressure, silver nitrite, and bandage was applied.  No complications were encountered during this procedure.    Assessment & Plan:   Melanocytic nevi of trunk Benign appearance, however given history of pruritus, change in color and size, and patient discomfort, proceeded with shave biopsy for further evaluation and resolution of irritation. History of pruritis and crusting of nevi was likely secondary to irritation due to bra strap. Patient tolerated procedure well,  provided printed wound care instructions.  --Send biopsy to pathology, f/u results  --F/u if region not healing or worsening, educated on signs of infection   Instructed to follow up when available for nevus biopsy/removal of additional nevus (R axilla) causing patient discomfort if she continues to desire this.   Darrelyn Hillock, DO Family Medicine Resident PGY-1

## 2018-12-24 ENCOUNTER — Encounter: Payer: Self-pay | Admitting: Family Medicine

## 2018-12-24 DIAGNOSIS — D225 Melanocytic nevi of trunk: Secondary | ICD-10-CM | POA: Insufficient documentation

## 2018-12-24 NOTE — Assessment & Plan Note (Addendum)
Benign appearance, however given history of pruritus, change in color and size, and patient discomfort, proceeded with shave biopsy for further evaluation and resolution of irritation. History of pruritis and crusting of nevi was likely secondary to irritation due to bra strap. Patient tolerated procedure well, provided printed wound care instructions.  --Send biopsy to pathology, f/u results  --F/u if region not healing or worsening, educated on signs of infection

## 2018-12-31 ENCOUNTER — Ambulatory Visit (INDEPENDENT_AMBULATORY_CARE_PROVIDER_SITE_OTHER): Payer: BLUE CROSS/BLUE SHIELD | Admitting: Family Medicine

## 2018-12-31 ENCOUNTER — Encounter: Payer: Self-pay | Admitting: Family Medicine

## 2018-12-31 ENCOUNTER — Other Ambulatory Visit: Payer: Self-pay

## 2018-12-31 VITALS — BP 110/72 | HR 80 | Ht 61.0 in | Wt 154.2 lb

## 2018-12-31 DIAGNOSIS — L989 Disorder of the skin and subcutaneous tissue, unspecified: Secondary | ICD-10-CM | POA: Insufficient documentation

## 2018-12-31 NOTE — Patient Instructions (Signed)
Thank you for coming to see me today. You wound appears to not be infected, however it does appear to be irritated from the antibiotic ointment and adhesives. I recommend using Vaseline and finding an alternative bandage to place over the wound. You can also allow the wound to air dry at times when you are home and able to. If you notice the wound getting more red, warm, or the drainage continues to worsen, you develop fevers or chills, please see [place] back sooner.  Dr. Mina Marble, DO Resident Physician [place] [phone]

## 2018-12-31 NOTE — Progress Notes (Signed)
   Subjective:   Patient ID: Sherry Shepherd    DOB: 02-13-1986, 33 y.o. female   MRN: [id number]  Sherry Shepherd is a 33 y.o. female here for follow up of mole removal.  Concern for Wound Infection: Patient had skin biopsy performed on 5/21 for lesion on anterior mid-clavicular left chest with normal pathology. Patient notes has kept it bandaged two times a day, cleaning it BID with anti-septic wipes (patting it), applies OTC triple antibiotic cream and a band aid. She notes some pain when bra strap lays on it. Does endorse some drainage - white and yellow color. Denies any fevers or chills.   Review of Systems:  Per HPI.   [place], medications and smoking status reviewed.  Objective:   BP 110/72   Pulse 80   Ht 5\' 1"  (1.549 m)   Wt 154 lb 4 oz (70 kg)   LMP 11/22/2018   SpO2 97%   BMI 29.15 kg/m  Vitals and nursing note reviewed.  General: pleasant young female, well nourished, well developed, in no acute distress with non-toxic appearance, sitting comfortably in exam chair  HEENT: normocephalic, atraumatic CV: regular rate and rhythm without murmurs, rubs, or gallops Lungs: clear to auscultation bilaterally with normal work of breathing Abdomen: soft, non-tender, non-distended, normoactive bowel sounds Skin: warm, dry, lesion along left mid-clavicular left chest with well demarcated raised edges without surrounding erythema or warmth, no drainage or bleeding appreciated MSK:  gait normal Neuro: Alert and oriented, speech normal      Assessment & Plan:   Skin lesion of upper extremity Lesion actually appears to be healing well. It does look slightly irritated, especially the surrounding skin. There is known risk for OTC topical antibiotic ointments to be irritating secondary to the additives and preservatives as well as the adhesives from excessive band-aid use. Wound reviewed with Dr. Erin Hearing with agreement in plan. - Recommended patient stop using the OTC topical antibiotic  ointment - Start vaseline ointment and bandage with minimal adhesive as possible - Expose wound to air when able - Return precautions discussed at length - If appears to be worsening, can call in Mupirocin ointment  Mina Marble, DO PGY-1, [facility] 12/31/2018 3:47 PM

## 2018-12-31 NOTE — Assessment & Plan Note (Addendum)
Lesion actually appears to be healing well. It does look slightly irritated, especially the surrounding skin. There is known risk for OTC topical antibiotic ointments to be irritating secondary to the additives and preservatives as well as the adhesives from excessive band-aid use. Wound reviewed with Dr. Erin Hearing with agreement in plan. - Recommended patient stop using the OTC topical antibiotic ointment - Start vaseline ointment and bandage with minimal adhesive as possible - Expose wound to air when able - Return precautions discussed at length - If appears to be worsening, can call in Mupirocin ointment

## 2019-01-25 DIAGNOSIS — Z6828 Body mass index (BMI) 28.0-28.9, adult: Secondary | ICD-10-CM | POA: Diagnosis not present

## 2019-01-25 DIAGNOSIS — N760 Acute vaginitis: Secondary | ICD-10-CM | POA: Diagnosis not present

## 2019-02-01 DIAGNOSIS — M778 Other enthesopathies, not elsewhere classified: Secondary | ICD-10-CM | POA: Diagnosis not present

## 2019-03-29 ENCOUNTER — Other Ambulatory Visit: Payer: Self-pay

## 2019-04-01 ENCOUNTER — Ambulatory Visit: Payer: BLUE CROSS/BLUE SHIELD | Admitting: Internal Medicine

## 2019-04-01 NOTE — Progress Notes (Deleted)
Name: Sherry Shepherd  MRN/ DOB: [id number], 22-Jun-1986    Age/ Sex: 33 y.o., female     PCP: Sherry Rio, MD   Reason for Endocrinology Evaluation: Hashimoto's Disease     Initial Endocrinology Clinic Visit: 10/01/2018    PATIENT IDENTIFIER: Sherry Shepherd is a 33 y.o., female with a past medical history of Migraine headaches and GERD . She has followed with [facility] since 10/01/2018 for consultative assistance with management of her Hashimoto's Diease  HISTORICAL SUMMARY: The patient was first diagnosed with thyroid goiter at age 33. Pt was lost to follow up until 2020 when she presented with c/o fatigue  And found to have Anti-TPO Ab's > 600 IU/mL   SUBJECTIVE:   During last visit (10/01/2018): TSH was 1.590 uIU/mL . No treatment offered.   Today (04/01/2019):  Sherry Shepherd is here for a follow up on Hashimoto's Disease.   She is on [place] for contraception.   ROS:  As per HPI.   HISTORY:  Past Medical History:  Past Medical History:  Diagnosis Date  . Allergy   . GERD (gastroesophageal reflux disease)   . Gestational diabetes   . Hx of varicella   . Lichen sclerosus of female genitalia   . Migraine     Past Surgical History:  Past Surgical History:  Procedure Laterality Date  . DENTAL SURGERY       Social History:  reports that she has never smoked. She has never used smokeless tobacco. She reports that she does not drink alcohol or use drugs. Family History:  Family History  Problem Relation Age of Onset  . Diabetes Mother   . Macular degeneration Mother   . Diabetes Father   . Hyperlipidemia Father   . Birth defects Brother        tongue tied  . Congestive Heart Failure Paternal Grandfather   . Colon cancer Neg Hx   . Esophageal cancer Neg Hx   . Stomach cancer Neg Hx   . Rectal cancer Neg Hx       HOME MEDICATIONS: Allergies as of 04/01/2019      Reactions   Amoxicillin Hives   Cefprozil Hives      Medication List      Accurate as of April 01, 2019  7:43 AM. If you have any questions, ask your nurse or doctor.        clobetasol ointment 0.05 % Commonly known as: TEMOVATE Apply 1 application topically as needed.   fluticasone 50 MCG/ACT nasal spray Commonly known as: FLONASE Place 2 sprays into both nostrils daily.   Norlyda 0.35 MG tablet Generic drug: norethindrone Take 1 tablet by mouth daily.   omeprazole 40 MG capsule Commonly known as: PRILOSEC Take 1 capsule (40 mg total) by mouth daily.   rizatriptan 5 MG disintegrating tablet Commonly known as: Maxalt-MLT Take 1 tablet (5 mg total) by mouth as needed. May repeat in 2 hours if needed         OBJECTIVE:   PHYSICAL EXAM: VS: There were no vitals taken for this visit.   EXAM: General: Pt appears well and is in NAD  Hydration: Well-hydrated with moist mucous membranes and good skin turgor  Eyes: External eye exam normal without stare, lid lag or exophthalmos.  EOM intact.  PERRL.  Ears, Nose, Throat: Hearing: Grossly intact bilaterally Dental: Good dentition  Throat: Clear without mass, erythema or exudate  Neck: General: Supple without adenopathy. Thyroid: Thyroid size normal.  No  goiter or nodules appreciated. No thyroid bruit.  Lungs: Clear with good BS bilat with no rales, rhonchi, or wheezes  Heart: Auscultation: RRR.  Abdomen: Normoactive bowel sounds, soft, nontender, without masses or organomegaly palpable  Extremities: Gait and station: Normal gait  Digits and nails: No clubbing, cyanosis, petechiae, or nodes Head and neck: Normal alignment and mobility BL UE: Normal ROM and strength. BL LE: No pretibial edema normal ROM and strength.  Skin: Hair: Texture and amount normal with gender appropriate distribution Skin Inspection: No rashes, acanthosis nigricans/skin tags. No lipohypertrophy Skin Palpation: Skin temperature, texture, and thickness normal to palpation  Neuro: Cranial nerves: II - XII grossly intact   Cerebellar: Normal coordination and movement; no tremor Motor: Normal strength throughout DTRs: 2+ and symmetric in UE without delay in relaxation phase  Mental Status: Judgment, insight: Intact Orientation: Oriented to time, place, and person Memory: Intact for recent and remote events Mood and affect: No depression, anxiety, or agitation     DATA REVIEWED: ***    ASSESSMENT / PLAN / RECOMMENDATIONS:   1. ***  Plan:  ***    Medications   ***   Signed electronically by: Sherry Guise, MD  [facility]  [address] [place], [place] [address] Phone: [phone] FAX: [phone]      CC: Sherry Shepherd, [place] [address] Phone: [phone]  Fax: [phone]   Return to Endocrinology clinic as below: Future Appointments  Date Time Provider [place]  04/01/2019  9:10 AM , Melanie Crazier, MD [facility] None

## 2019-04-08 ENCOUNTER — Encounter: Payer: Self-pay | Admitting: Internal Medicine

## 2019-04-08 ENCOUNTER — Ambulatory Visit (INDEPENDENT_AMBULATORY_CARE_PROVIDER_SITE_OTHER): Payer: Managed Care, Other (non HMO) | Admitting: Internal Medicine

## 2019-04-08 ENCOUNTER — Other Ambulatory Visit: Payer: Self-pay

## 2019-04-08 VITALS — BP 110/80 | HR 66 | Temp 98.4°F | Ht 61.0 in | Wt 152.6 lb

## 2019-04-08 DIAGNOSIS — E063 Autoimmune thyroiditis: Secondary | ICD-10-CM | POA: Diagnosis not present

## 2019-04-08 LAB — T4, FREE: Free T4: 0.9 ng/dL (ref 0.60–1.60)

## 2019-04-08 LAB — TSH: TSH: 1.8 u[IU]/mL (ref 0.35–4.50)

## 2019-04-08 NOTE — Patient Instructions (Signed)
-   You have Hashimoto's Disease but your thyroid works normal. There are no interventions that we can provide with hashimoto's disease unless your thyroid stops working and at that time, thyroid hormone replacement therapy will be offered.  - In the meantime I suggest having your thyroid checked once a year or sooner if symptomatic   - No need for serial ultrasound

## 2019-04-08 NOTE — Progress Notes (Signed)
Name: Sherry Shepherd  MRN/ DOB: [id number], 04-04-1986    Age/ Sex: 33 y.o., female     PCP: Leeanne Rio, MD   Reason for Endocrinology Evaluation: Hashimoto's Disease     Initial Endocrinology Clinic Visit: 10/01/2018    PATIENT IDENTIFIER: Sherry Shepherd is a 33 y.o., female with a past medical history of Migraine headaches and GERD . She has followed with [facility] since 10/01/2018 for consultative assistance with management of her Hashimoto's Diease  HISTORICAL SUMMARY: The patient was first diagnosed with thyroid goiter at age 21. Pt was lost to follow up until 2020 when she presented with c/o fatigue  And found to have Anti-TPO Ab's > 600 IU/mL   SUBJECTIVE:   During last visit (10/01/2018): TSH was 1.590 uIU/mL . No treatment offered.   Today (04/08/2019):  Sherry Shepherd is here for a follow up on Hashimoto's Disease.  Denies any change in energy   No new constipation  Denies depression She has cold intolerance.   She is on [place] for contraception.   ROS:  As per HPI.   HISTORY:  Past Medical History:  Past Medical History:  Diagnosis Date  . Allergy   . GERD (gastroesophageal reflux disease)   . Gestational diabetes   . Hx of varicella   . Lichen sclerosus of female genitalia   . Migraine    Past Surgical History:  Past Surgical History:  Procedure Laterality Date  . DENTAL SURGERY      Social History:  reports that she has never smoked. She has never used smokeless tobacco. She reports that she does not drink alcohol or use drugs. Family History:  Family History  Problem Relation Age of Onset  . Diabetes Mother   . Macular degeneration Mother   . Diabetes Father   . Hyperlipidemia Father   . Birth defects Brother        tongue tied  . Congestive Heart Failure Paternal Grandfather   . Colon cancer Neg Hx   . Esophageal cancer Neg Hx   . Stomach cancer Neg Hx   . Rectal cancer Neg Hx      HOME MEDICATIONS: Allergies as of  04/08/2019      Reactions   Amoxicillin Hives   Cefprozil Hives      Medication List       Accurate as of April 08, 2019  9:34 AM. If you have any questions, ask your nurse or doctor.        clobetasol ointment 0.05 % Commonly known as: TEMOVATE Apply 1 application topically as needed.   fluticasone 50 MCG/ACT nasal spray Commonly known as: FLONASE Place 2 sprays into both nostrils daily.   Norlyda 0.35 MG tablet Generic drug: norethindrone Take 1 tablet by mouth daily.   omeprazole 40 MG capsule Commonly known as: PRILOSEC Take 1 capsule (40 mg total) by mouth daily.   rizatriptan 5 MG disintegrating tablet Commonly known as: Maxalt-MLT Take 1 tablet (5 mg total) by mouth as needed. May repeat in 2 hours if needed         OBJECTIVE:   PHYSICAL EXAM: VS: BP 110/80 (BP Location: Left Arm, Patient Position: Sitting, Cuff Size: Normal)   Pulse 66   Temp 98.4 F (36.9 C)   Ht 5\' 1"  (1.549 m)   Wt 152 lb 9.6 oz (69.2 kg)   SpO2 96%   BMI 28.83 kg/m    EXAM: General: Pt appears well and is in NAD  Lungs: Clear with good BS bilat with no rales, rhonchi, or wheezes  Heart: Auscultation: RRR.  Abdomen: Normoactive bowel sounds, soft, nontender, without masses or organomegaly palpable  Extremities: BL UE: Normal ROM and strength. BL LE: No pretibial edema normal ROM and strength.  Skin: Hair: Texture and amount normal with gender appropriate distribution Skin Inspection: No rashes, acanthosis nigricans/skin tags. No lipohypertrophy Skin Palpation: Skin temperature, texture, and thickness normal to palpation  Neuro: Cranial nerves: II - XII grossly intact  Motor: Normal strength throughout DTRs: 2+ and symmetric in UE without delay in relaxation phase  Mental Status: Judgment, insight: Intact Orientation: Oriented to time, place, and person Mood and affect: No depression, anxiety, or agitation     DATA REVIEWED:  Results for Gasser, DENI (MRN [id number]) as  of 04/08/2019 13:04  Ref. Range 09/14/2018 10:50 04/08/2019 09:49  TSH Latest Ref Range: 0.35 - 4.50 uIU/mL  1.80  T4,Free(Direct) Latest Ref Range: 0.60 - 1.60 ng/dL  0.90     ASSESSMENT / PLAN / RECOMMENDATIONS:   1. Hashimoto's Disease:   - Pt is clinically euthyroid - I explained to the patient that Hashimoto's Disease is an autoimmune - mediated destruction of the thyroid gland. The usual course of Hashimoto's thyroiditis is the gradual loss of thyroid function. Overt hypothyroidism occurs at a rate of ~ 5% per year.    Recommendations  Annual TFT's at PCP's office , or sooner if symptomatic   2. Thyromegaly :   - No nodules on ultrasound  - Thyromegaly is not unusual in the setting of hashimoto's thyroid disease.  - No serial images are recommended.    F/U PRN    Signed electronically by: Mack Guise, MD  [facility]  [address] [place], [place] [address] Phone: [phone] FAX: [phone]      CC: Leeanne Rio, [place] [address] Phone: [phone]  Fax: [phone]   Return to Endocrinology clinic as below: No future appointments.

## 2019-06-03 ENCOUNTER — Other Ambulatory Visit: Payer: Self-pay

## 2019-06-03 DIAGNOSIS — Z20822 Contact with and (suspected) exposure to covid-19: Secondary | ICD-10-CM

## 2019-06-05 LAB — NOVEL CORONAVIRUS, NAA: SARS-CoV-2, NAA: NOT DETECTED

## 2020-01-03 IMAGING — US US THYROID
1 series · 14 of 25 positions shown · non-contrast
Comparison: None.

CLINICAL DATA: [place]

EXAM:
THYROID ULTRASOUND
TECHNIQUE: Ultrasound examination of the thyroid gland and adjacent soft
tissues was performed.

[Series 1: us thyroid · 0.06mm/px · 14 of 52 slices shown]
[im 1/52]
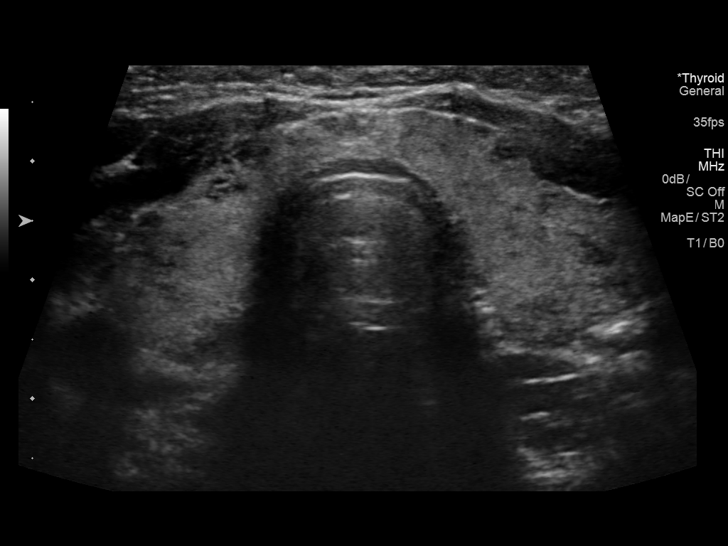
[im 5/52]
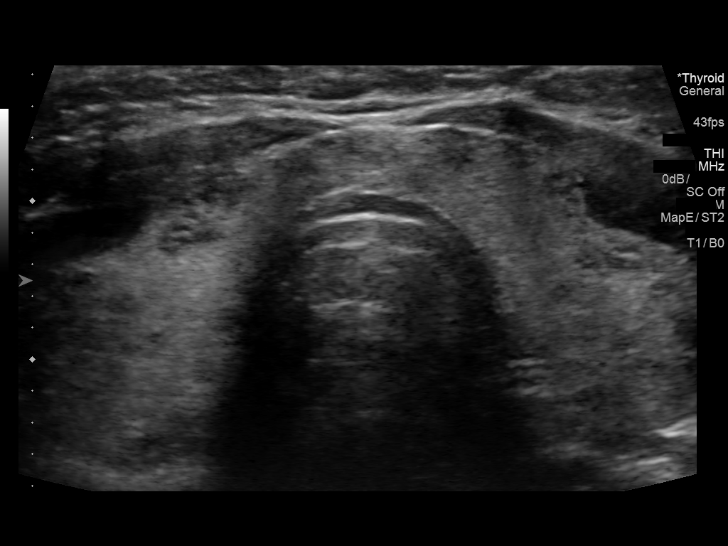
[im 9/52]
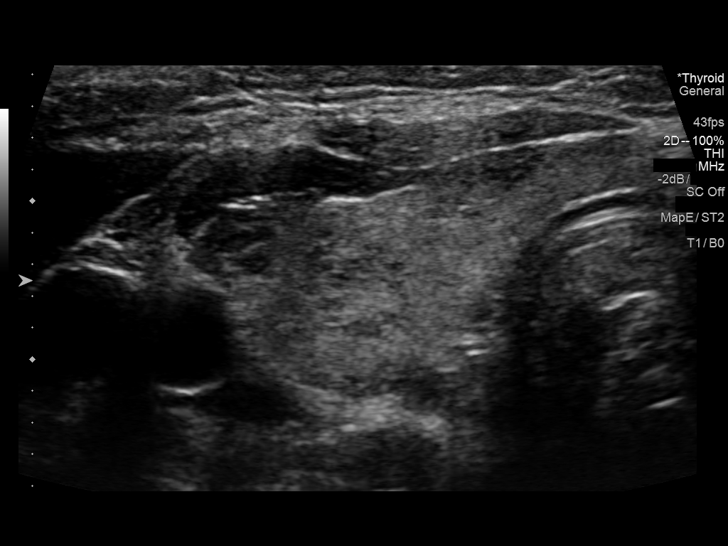
[im 13/52]
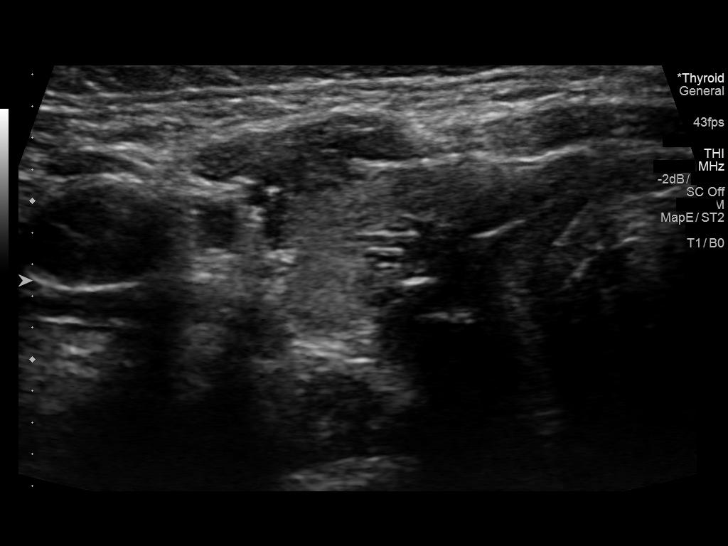
[im 18/52]
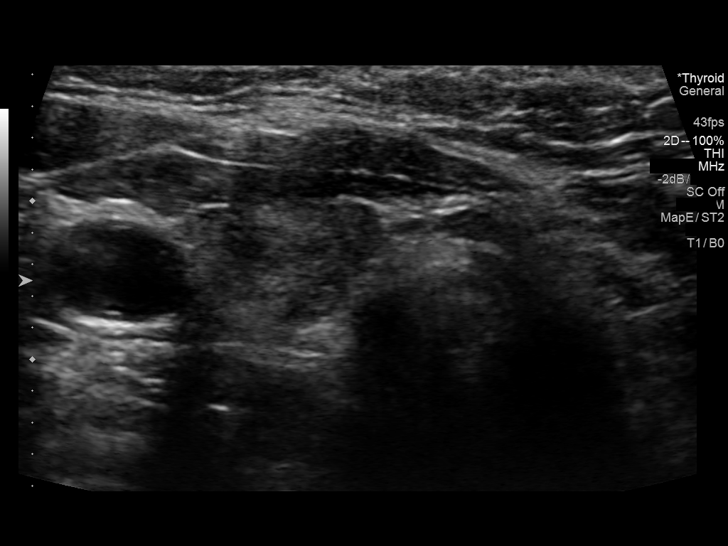
[im 20/52]
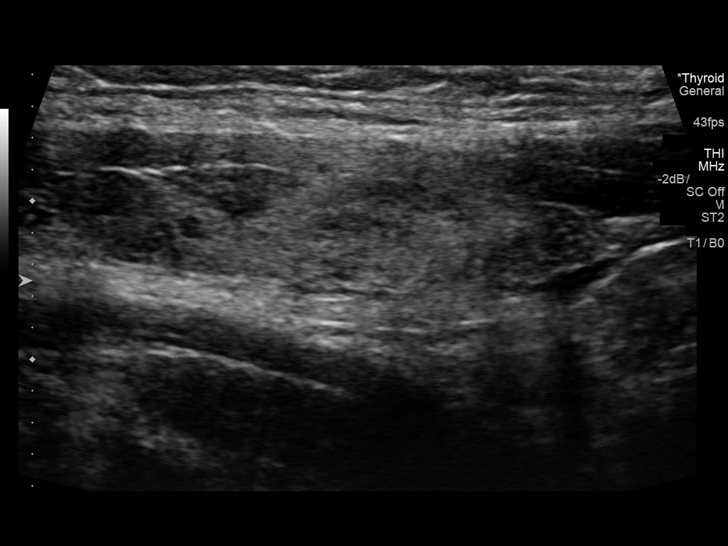
[im 24/52]
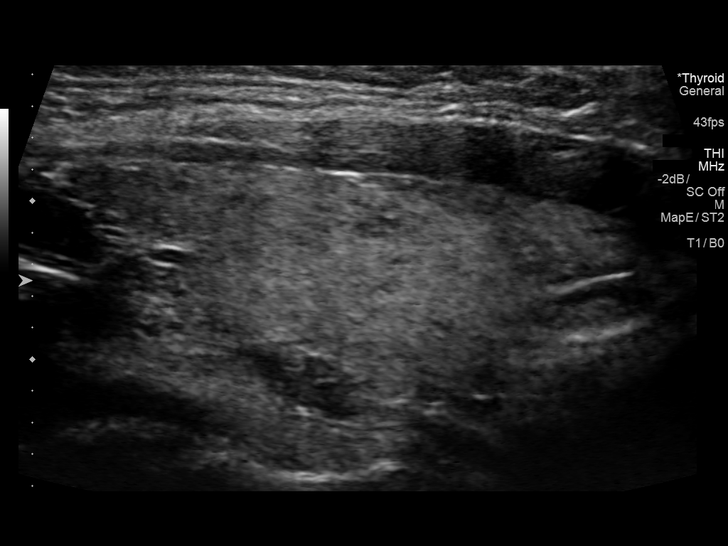
[im 28/52]
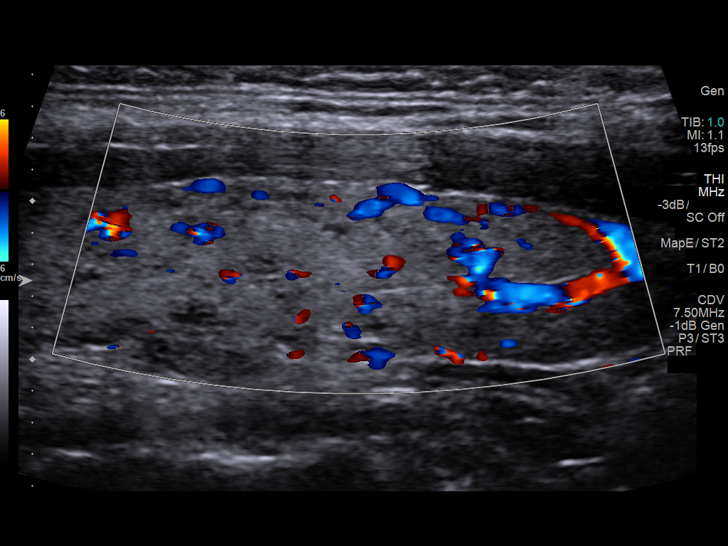
[im 32/52]
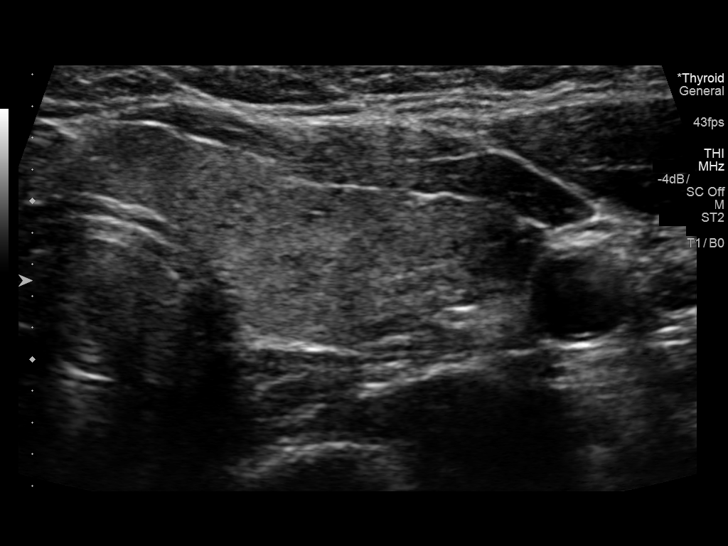
[im 35/52]
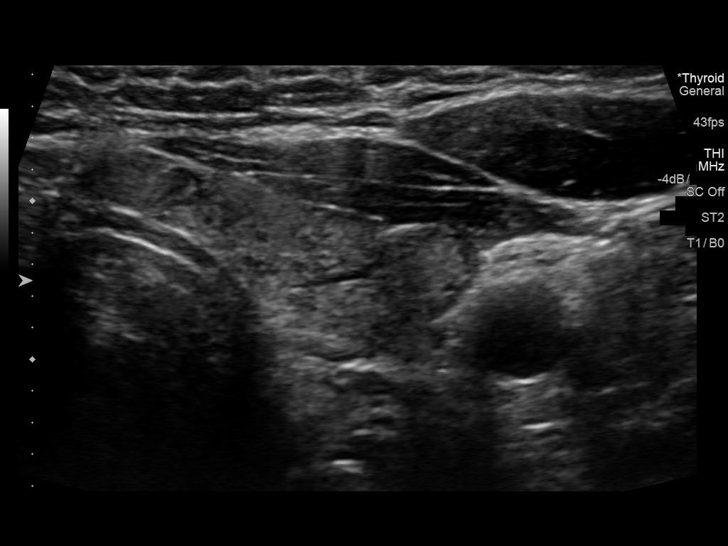
[im 39/52]
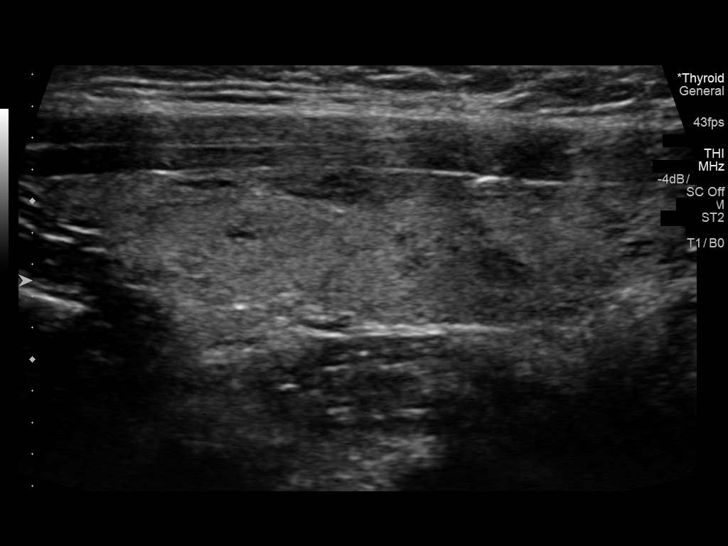
[im 43/52]
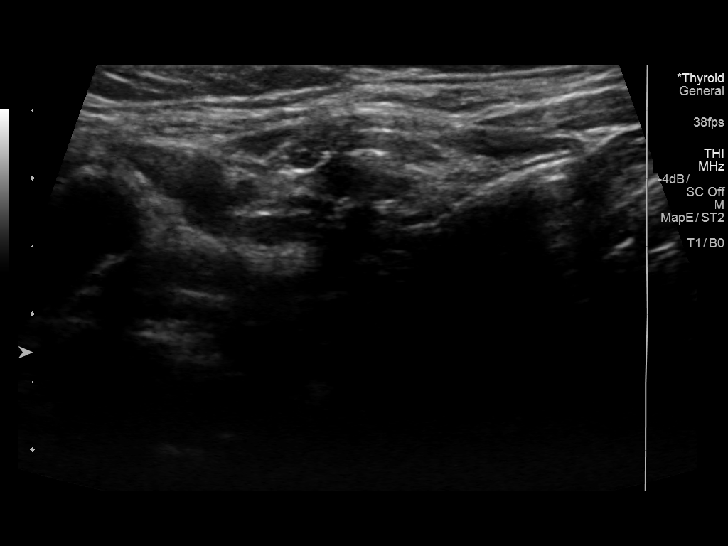
[im 47/52]
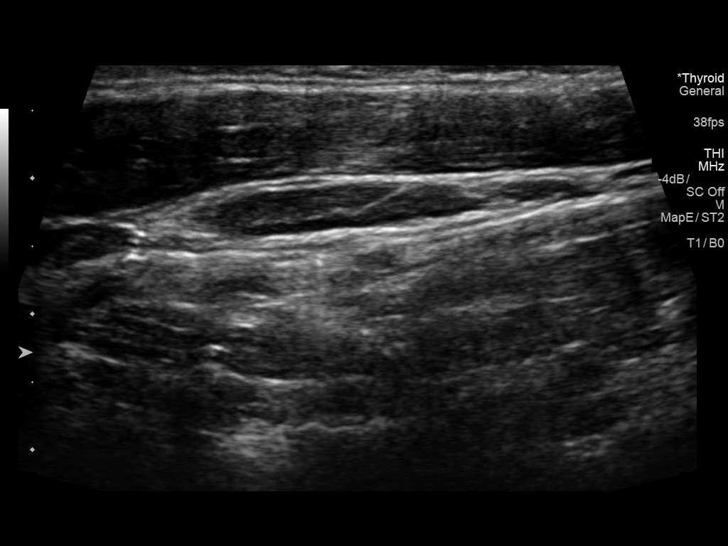
[im 52/52]
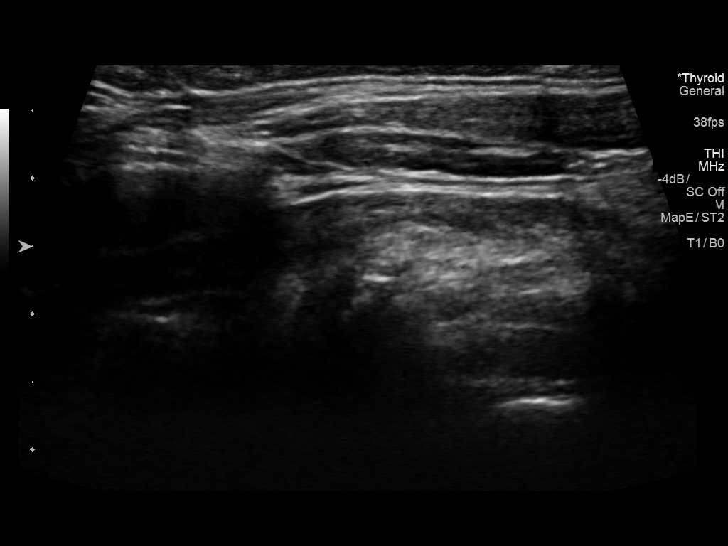

[14 of 25 positions shown; findings below may reference images not displayed]

FINDINGS: Parenchymal Echotexture: Moderately heterogenous

Isthmus: 0.5 cm thickness

Right lobe: 5.2 x 1.2 x 2.1 cm

Left lobe: 5.2 x 1.1 x 2 cm

_________________________________________________________

Estimated total number of nodules >/= 1 cm: 0

Number of spongiform nodules >/=  2 cm not described below (TR1): 0

Number of mixed cystic and solid nodules >/= 1.5 cm not described
below (TR2): 0

_________________________________________________________

No discrete nodules are seen within the thyroid gland. No regional
cervical adenopathy on limited interrogation.
IMPRESSION: 1. Borderline enlarged thyroid with moderately heterogenous
parenchyma, no nodule.

The above is in keeping with the ACR TI-RADS recommendations - [HOSPITAL] 2221;[DATE].

## 2020-05-21 ENCOUNTER — Other Ambulatory Visit: Payer: Managed Care, Other (non HMO)

## 2020-05-21 DIAGNOSIS — Z20822 Contact with and (suspected) exposure to covid-19: Secondary | ICD-10-CM

## 2020-05-22 LAB — NOVEL CORONAVIRUS, NAA: SARS-CoV-2, NAA: NOT DETECTED

## 2020-05-22 LAB — SARS-COV-2, NAA 2 DAY TAT

## 2020-06-19 IMAGING — RF ESOPHAGUS/BARIUM SWALLOW/TABLET STUDY
11 of 13 series · 17 of 24 positions shown · non-contrast
Comparison: None.

CLINICAL DATA: Hiatal hernia.  Reflux.

EXAM:
ESOPHOGRAM/BARIUM SWALLOW
TECHNIQUE: Combined double contrast and single contrast examination performed
using effervescent crystals, thick barium liquid, and thin barium
liquid.
FLUOROSCOPY TIME:  Fluoroscopy Time:  96 seconds
Radiation Exposure Index (if provided by the fluoroscopic device):
12.9 mGy
Number of Acquired Spot Images: 0

[Series 1: cp_standard · 0.51mm/px · 1 of 103 frames shown (1 of 11)]
[frame 16/103]
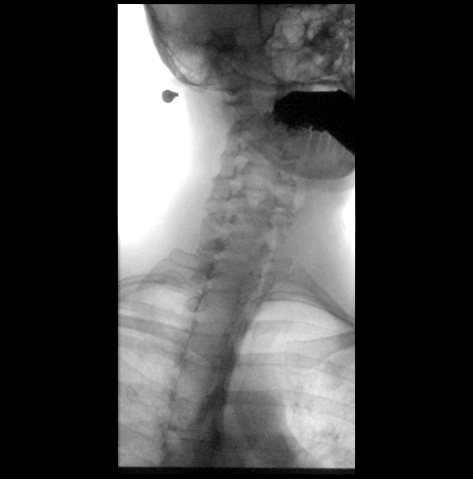

[Series 2: cp_standard · 0.51mm/px · 3 of 20 frames shown (2 of 11)]
[frame 4/20]
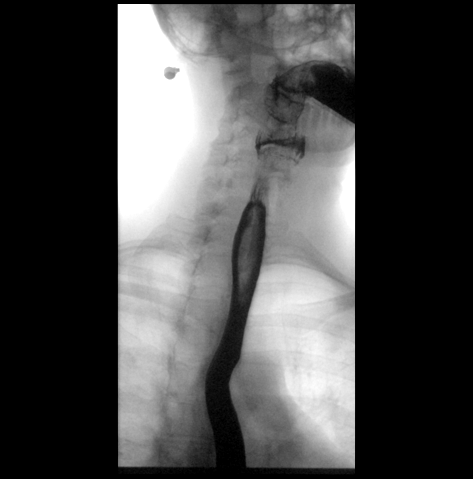
[frame 11/20]
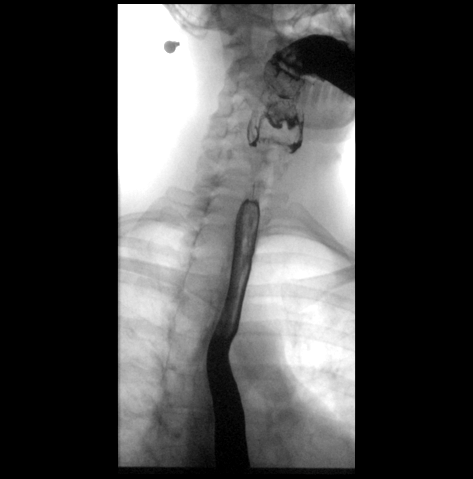
[frame 20/20]
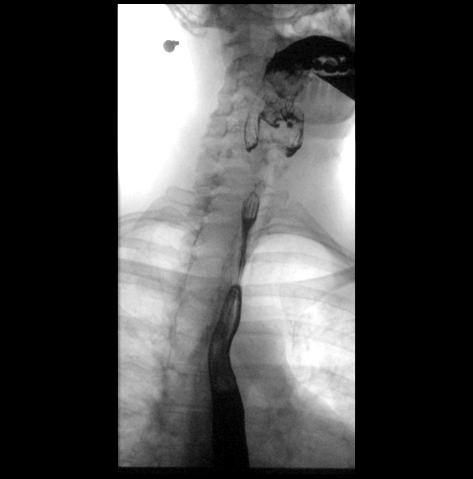

[Series 3: cp_standard · 0.51mm/px · 1 of 21 frames shown (3 of 11)]
[frame 18/21]
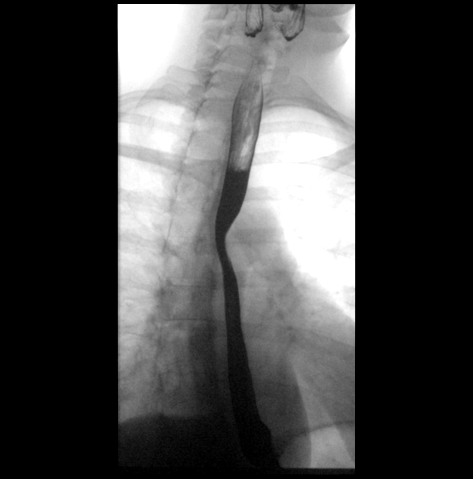

[Series 4: cp_standard · 0.51mm/px · 1 of 27 frames shown (4 of 11)]
[frame 5/27]
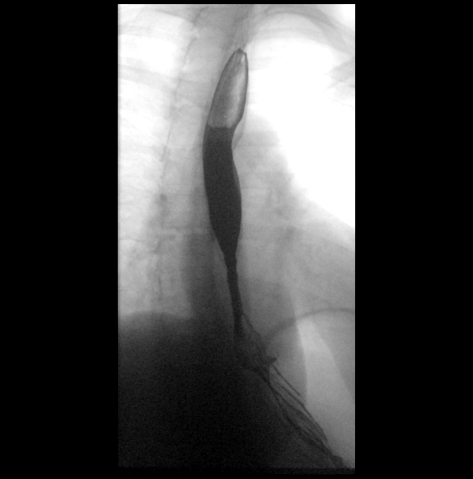

[Series 5: cp_standard · 0.51mm/px · 2 of 59 frames shown (5 of 11)]
[frame 9/59]
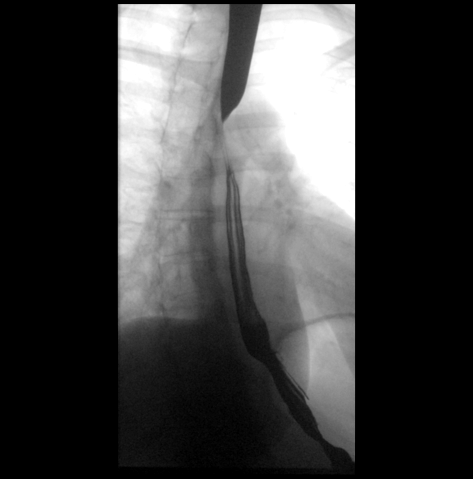
[frame 30/59]
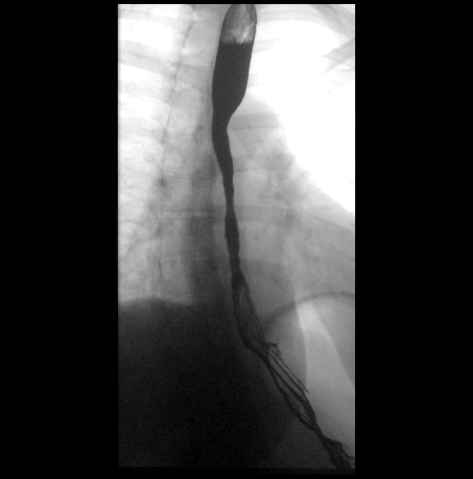

[Series 7: cp_standard · 0.25mm/px · 1 of 1 slices shown (6 of 11)]
[im 1/1]
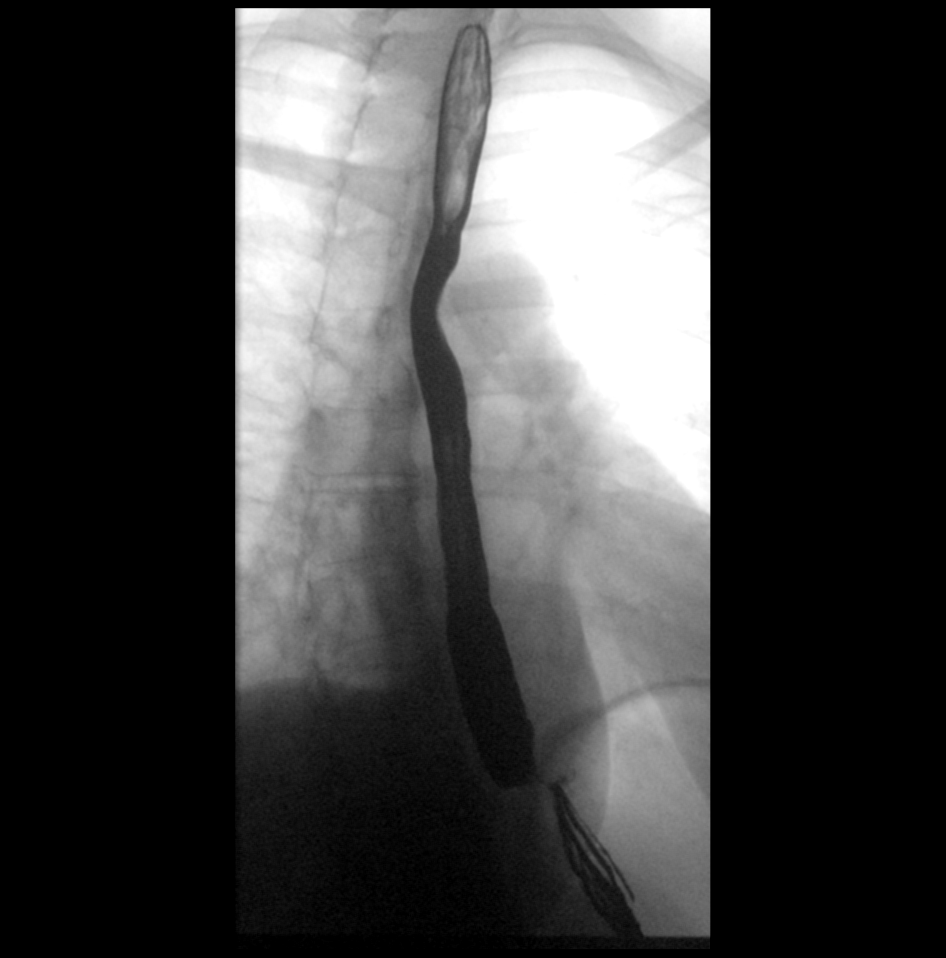

[Series 8: cp_standard · 0.51mm/px · 2 of 96 frames shown (7 of 11)]
[frame 49/96]
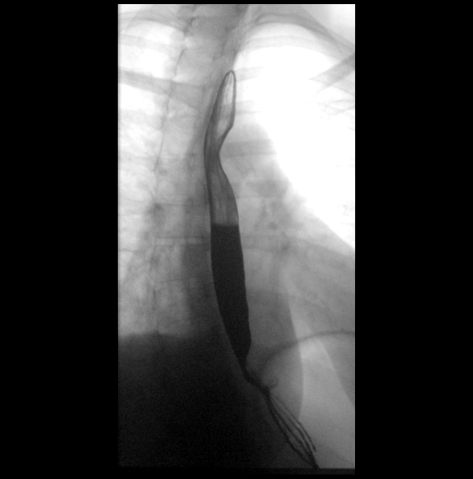
[frame 96/96]
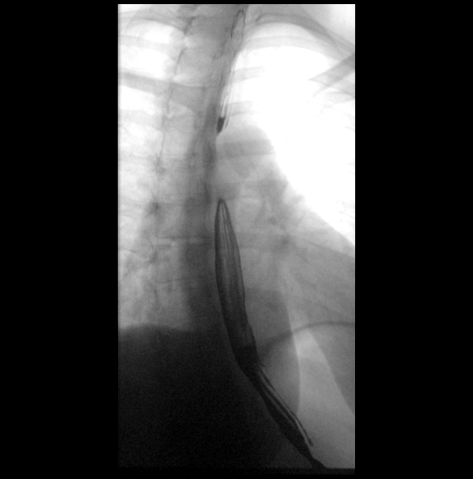

[Series 12: cp_standard · 0.25mm/px · 1 of 1 slices shown (8 of 11)]
[im 1/1]
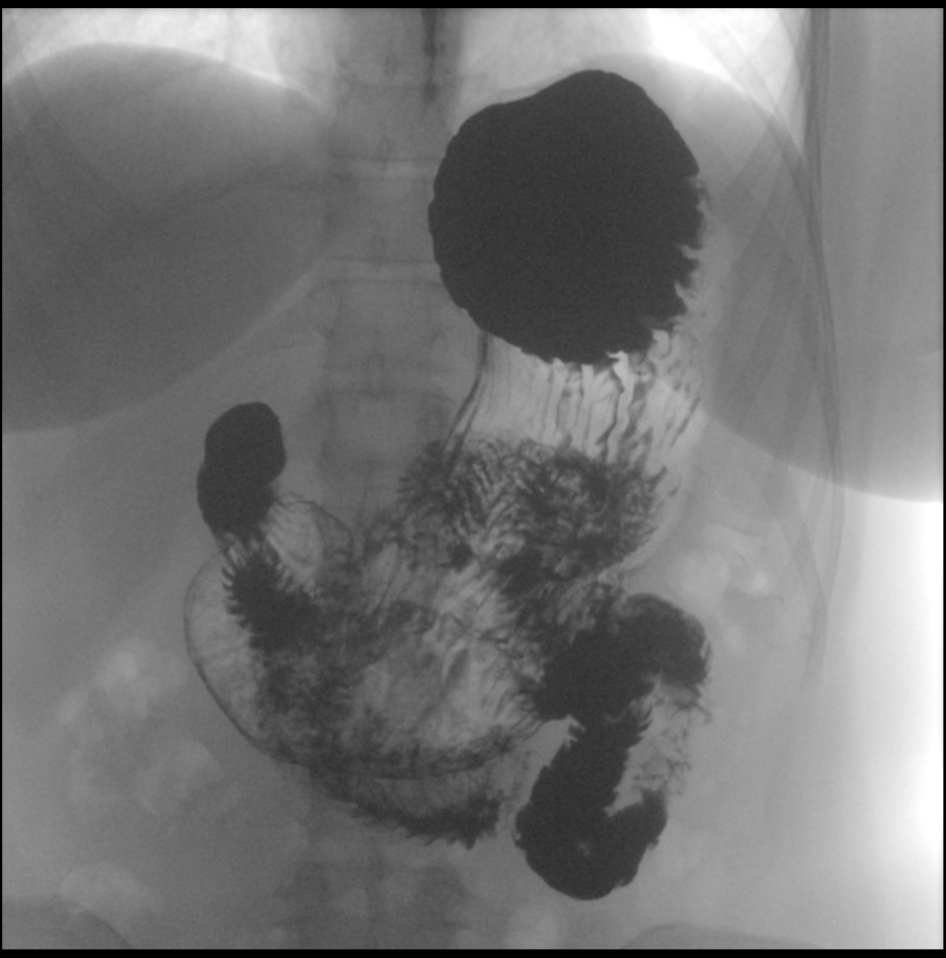

[Series 13: cp_standard · 0.51mm/px · 1 of 100 frames shown (9 of 11)]
[frame 16/100]
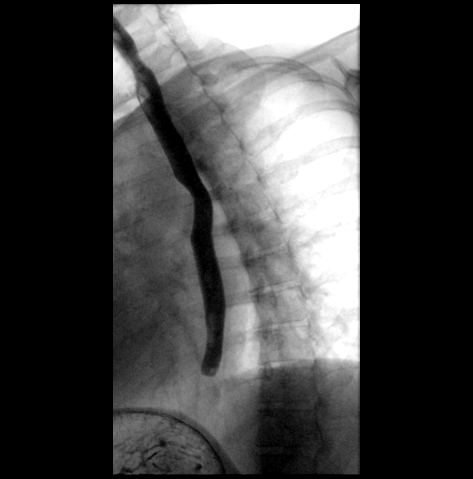

[Series 14: cp_standard · 0.51mm/px · 3 of 136 frames shown (10 of 11)]
[frame 21/136]
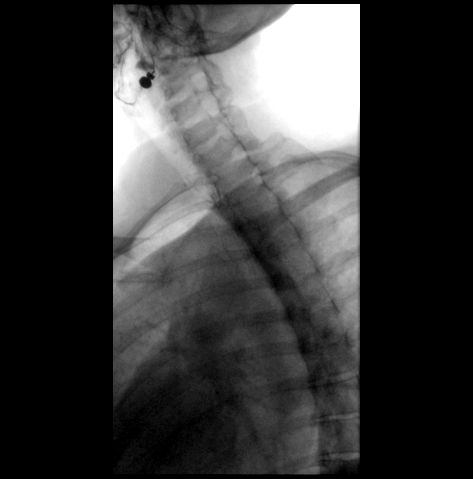
[frame 116/136]
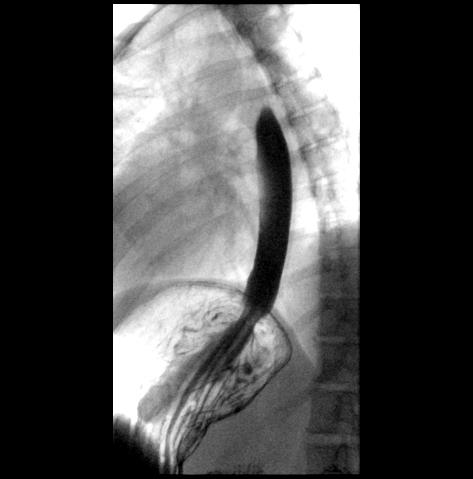
[frame 128/136]
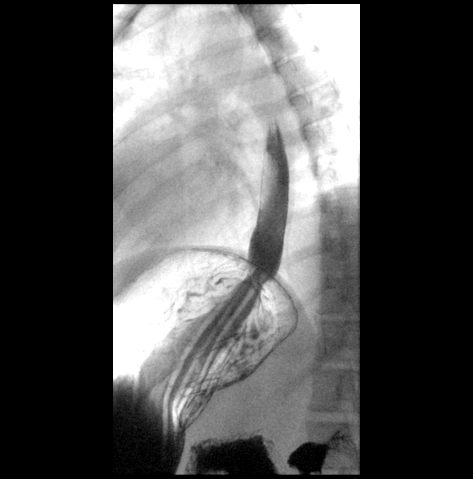

[Series 15: cp_standard · 0.51mm/px · 1 of 94 frames shown (11 of 11)]
[frame 80/94]
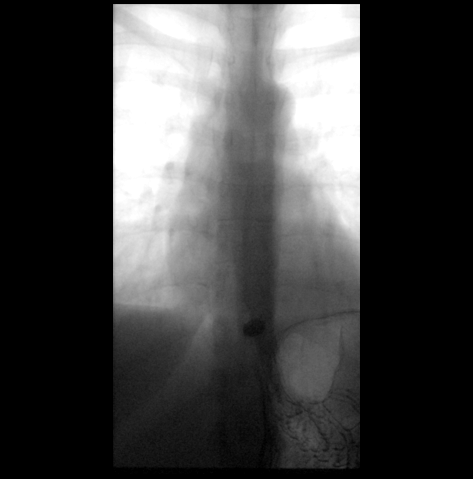

[17 of 24 positions shown; findings below may reference images not displayed]

FINDINGS: No stricture, mass, or mucosal abnormality. The barium tablet passed
normally. A single view of the stomach is unremarkable.
IMPRESSION: No abnormalities identified.

## 2020-09-11 ENCOUNTER — Encounter: Payer: Managed Care, Other (non HMO) | Admitting: Family Medicine

## 2020-09-18 ENCOUNTER — Other Ambulatory Visit: Payer: Self-pay

## 2020-09-18 ENCOUNTER — Ambulatory Visit (INDEPENDENT_AMBULATORY_CARE_PROVIDER_SITE_OTHER): Payer: Managed Care, Other (non HMO) | Admitting: Family Medicine

## 2020-09-18 ENCOUNTER — Encounter: Payer: Self-pay | Admitting: Family Medicine

## 2020-09-18 ENCOUNTER — Other Ambulatory Visit: Payer: Self-pay | Admitting: Family Medicine

## 2020-09-18 VITALS — BP 104/72 | HR 92 | Wt 159.6 lb

## 2020-09-18 DIAGNOSIS — E0590 Thyrotoxicosis, unspecified without thyrotoxic crisis or storm: Secondary | ICD-10-CM | POA: Diagnosis not present

## 2020-09-18 DIAGNOSIS — N644 Mastodynia: Secondary | ICD-10-CM

## 2020-09-18 DIAGNOSIS — E785 Hyperlipidemia, unspecified: Secondary | ICD-10-CM

## 2020-09-18 DIAGNOSIS — N62 Hypertrophy of breast: Secondary | ICD-10-CM

## 2020-09-18 DIAGNOSIS — D229 Melanocytic nevi, unspecified: Secondary | ICD-10-CM

## 2020-09-18 DIAGNOSIS — Z1159 Encounter for screening for other viral diseases: Secondary | ICD-10-CM

## 2020-09-18 DIAGNOSIS — K219 Gastro-esophageal reflux disease without esophagitis: Secondary | ICD-10-CM

## 2020-09-18 NOTE — Addendum Note (Signed)
Addended by: Leeanne Rio on: 09/18/2020 11:53 AM   Modules accepted: Orders

## 2020-09-18 NOTE — Assessment & Plan Note (Signed)
Update TSH and free T4 today. 

## 2020-09-18 NOTE — Assessment & Plan Note (Signed)
Doing well on daily omeprazole 40mg . Continue.

## 2020-09-18 NOTE — Assessment & Plan Note (Signed)
Refer to plastic surgery to discuss breast reduction in order to treat her back pain.

## 2020-09-18 NOTE — Patient Instructions (Addendum)
It was great to see you again today!  Referring to dermatologist and plastic surgeon  Getting ultrasound of your breast - we will call you with an appointment   Checking labs today  Be well, Dr. Ardelia Mems    Health Maintenance, Female Adopting a healthy lifestyle and getting preventive care are important in promoting health and wellness. Ask your health care provider about:  The right schedule for you to have regular tests and exams.  Things you can do on your own to prevent diseases and keep yourself healthy. What should I know about diet, weight, and exercise? Eat a healthy diet  Eat a diet that includes plenty of vegetables, fruits, low-fat dairy products, and lean protein.  Do not eat a lot of foods that are high in solid fats, added sugars, or sodium.   Maintain a healthy weight Body mass index (BMI) is used to identify weight problems. It estimates body fat based on height and weight. Your health care provider can help determine your BMI and help you achieve or maintain a healthy weight. Get regular exercise Get regular exercise. This is one of the most important things you can do for your health. Most adults should:  Exercise for at least 150 minutes each week. The exercise should increase your heart rate and make you sweat (moderate-intensity exercise).  Do strengthening exercises at least twice a week. This is in addition to the moderate-intensity exercise.  Spend less time sitting. Even light physical activity can be beneficial. Watch cholesterol and blood lipids Have your blood tested for lipids and cholesterol at 36 years of age, then have this test every 5 years. Have your cholesterol levels checked more often if:  Your lipid or cholesterol levels are high.  You are older than 36 years of age.  You are at high risk for heart disease. What should I know about cancer screening? Depending on your health history and family history, you may need to have cancer  screening at various ages. This may include screening for:  Breast cancer.  Cervical cancer.  Colorectal cancer.  Skin cancer.  Lung cancer. What should I know about heart disease, diabetes, and high blood pressure? Blood pressure and heart disease  High blood pressure causes heart disease and increases the risk of stroke. This is more likely to develop in people who have high blood pressure readings, are of African descent, or are overweight.  Have your blood pressure checked: ? Every 3-5 years if you are 109-36 years of age. ? Every year if you are 75 years old or older. Diabetes Have regular diabetes screenings. This checks your fasting blood sugar level. Have the screening done:  Once every three years after age 26 if you are at a normal weight and have a low risk for diabetes.  More often and at a younger age if you are overweight or have a high risk for diabetes. What should I know about preventing infection? Hepatitis B If you have a higher risk for hepatitis B, you should be screened for this virus. Talk with your health care provider to find out if you are at risk for hepatitis B infection. Hepatitis C Testing is recommended for:  Everyone born from [birth year] through 1966.  Anyone with known risk factors for hepatitis C. Sexually transmitted infections (STIs)  Get screened for STIs, including gonorrhea and chlamydia, if: ? You are sexually active and are younger than 36 years of age. ? You are older than 36 years of age and  your health care provider tells you that you are at risk for this type of infection. ? Your sexual activity has changed since you were last screened, and you are at increased risk for chlamydia or gonorrhea. Ask your health care provider if you are at risk.  Ask your health care provider about whether you are at high risk for HIV. Your health care provider may recommend a prescription medicine to help prevent HIV infection. If you choose to take  medicine to prevent HIV, you should first get tested for HIV. You should then be tested every 3 months for as long as you are taking the medicine. Pregnancy  If you are about to stop having your period (premenopausal) and you may become pregnant, seek counseling before you get pregnant.  Take 400 to 800 micrograms (mcg) of folic acid every day if you become pregnant.  Ask for birth control (contraception) if you want to prevent pregnancy. Osteoporosis and menopause Osteoporosis is a disease in which the bones lose minerals and strength with aging. This can result in bone fractures. If you are 71 years old or older, or if you are at risk for osteoporosis and fractures, ask your health care provider if you should:  Be screened for bone loss.  Take a calcium or vitamin D supplement to lower your risk of fractures.  Be given hormone replacement therapy (HRT) to treat symptoms of menopause. Follow these instructions at home: Lifestyle  Do not use any products that contain nicotine or tobacco, such as cigarettes, e-cigarettes, and chewing tobacco. If you need help quitting, ask your health care provider.  Do not use street drugs.  Do not share needles.  Ask your health care provider for help if you need support or information about quitting drugs. Alcohol use  Do not drink alcohol if: ? Your health care provider tells you not to drink. ? You are pregnant, may be pregnant, or are planning to become pregnant.  If you drink alcohol: ? Limit how much you use to 0-1 drink a day. ? Limit intake if you are breastfeeding.  Be aware of how much alcohol is in your drink. In the [place] one drink equals one 12 oz bottle of beer (355 mL), one 5 oz glass of wine (148 mL), or one 1 oz glass of hard liquor (44 mL). General instructions  Schedule regular health, dental, and eye exams.  Stay current with your vaccines.  Tell your health care provider if: ? You often feel depressed. ? You have  ever been abused or do not feel safe at home. Summary  Adopting a healthy lifestyle and getting preventive care are important in promoting health and wellness.  Follow your health care provider's instructions about healthy diet, exercising, and getting tested or screened for diseases.  Follow your health care provider's instructions on monitoring your cholesterol and blood pressure. This information is not intended to replace advice given to you by your health care provider. Make sure you discuss any questions you have with your health care provider. Document Revised: 07/14/2018 Document Reviewed: 07/14/2018 Elsevier Patient Education  2021 [employer].

## 2020-09-18 NOTE — Assessment & Plan Note (Signed)
Refer to dermatology for assessment of moles

## 2020-09-18 NOTE — Progress Notes (Signed)
  Date of Visit: 09/18/2020   SUBJECTIVE:   HPI:  Sherry Shepherd presents today for a well woman exam.   Concerns today: see below Periods: monthly, every 28 or so days. Diagnosed with adenomyosis. Attempted to have IUD placed at [facility] office but was not able to be inserted (very painful and not successful insertion). Has been recommended to consider hysterectomy. Contraception: none. Is open to the idea of being pregnant. With first child had to IUI STD Screening: declines today Pap smear status: UTD via GYN office Exercise: no regular exercise Smoking: no Alcohol: no Drugs: no Mood: no concerns Cancers in family: none  Moles - Has multiple moles she wants checked. Has not been to dermatologist in years.  L breast pain - Having tenderness in left breast at 12oclock position. Has not felt any masses. No nipple discharge.  Macromastia - having pain in back from having large breasts. Is interested in referral to plastic surgeon to discuss breast reduction. Wears 77F bra.  GERD - taking omeprazole 40mg  daily with good results.  OBJECTIVE:   BP 104/72   Pulse 92   Wt 159 lb 9.6 oz (72.4 kg)   SpO2 96%   BMI 30.16 kg/m  Gen: NAD, pleasant, cooperative HEENT: NCAT, PERRL, no palpable thyromegaly or anterior cervical lymphadenopathy Heart: RRR, no murmurs Lungs: CTAB, NWOB Abdomen: soft, nontender to palpation Neuro: grossly nonfocal, speech normal Breasts: bilateral breasts normal in appearance. No erythema, deformity, or nipple discharge. No palpable abnormal masses. No axillary lymphadenopathy. Chaperone present (Sherry Shepherd, CMA)   ASSESSMENT/PLAN:   Health maintenance:  -STD screening: declines today -pap smear: current at [facility] office -lipid screening: update lipids and CMET today -hep C screening: done today -immunizations:  . Flu: UTD . Tdap: UTD . COVID: UTD -handout given on health maintenance topics  L breast pain Normal exam today. Will check ultrasound for  reassurance.  Multiple nevi Refer to dermatology for assessment of moles  Subclinical hyperthyroidism Update TSH and free T4 today  Gastroesophageal reflux disease Doing well on daily omeprazole 40mg . Continue.   Macromastia Refer to plastic surgery to discuss breast reduction in order to treat her back pain.  FOLLOW UP: Follow up in 1 year for above issues Referring to dermatology Referring to plastic surgery  Sherry Shepherd, [name]

## 2020-09-19 LAB — CMP14+EGFR
ALT: 24 IU/L (ref 0–32)
AST: 22 IU/L (ref 0–40)
Albumin/Globulin Ratio: 1.8 (ref 1.2–2.2)
Albumin: 4.8 g/dL (ref 3.8–4.8)
Alkaline Phosphatase: 92 IU/L (ref 44–121)
BUN/Creatinine Ratio: 10 (ref 9–23)
BUN: 8 mg/dL (ref 6–20)
Bilirubin Total: 0.2 mg/dL (ref 0.0–1.2)
CO2: 19 mmol/L — ABNORMAL LOW (ref 20–29)
Calcium: 9.5 mg/dL (ref 8.7–10.2)
Chloride: 101 mmol/L (ref 96–106)
Creatinine, Ser: 0.79 mg/dL (ref 0.57–1.00)
GFR calc Af Amer: 112 mL/min/{1.73_m2} (ref >59)
GFR calc non Af Amer: 97 mL/min/{1.73_m2} (ref >59)
Globulin, Total: 2.6 g/dL (ref 1.5–4.5)
Glucose: 87 mg/dL (ref 65–99)
Potassium: 4.3 mmol/L (ref 3.5–5.2)
Sodium: 138 mmol/L (ref 134–144)
Total Protein: 7.4 g/dL (ref 6.0–8.5)

## 2020-09-19 LAB — LIPID PANEL
Chol/HDL Ratio: 5.3 ratio — ABNORMAL HIGH (ref 0.0–4.4)
Cholesterol, Total: 217 mg/dL — ABNORMAL HIGH (ref 100–199)
HDL: 41 mg/dL (ref >39)
LDL Chol Calc (NIH): 110 mg/dL — ABNORMAL HIGH (ref 0–99)
Triglycerides: 384 mg/dL — ABNORMAL HIGH (ref 0–149)
VLDL Cholesterol Cal: 66 mg/dL — ABNORMAL HIGH (ref 5–40)

## 2020-09-19 LAB — HCV AB W REFLEX TO QUANT PCR: HCV Ab: <0.1 s/co ratio (ref 0.0–0.9)

## 2020-09-19 LAB — HCV INTERPRETATION

## 2020-09-19 LAB — TSH: TSH: 1.99 u[IU]/mL (ref 0.450–4.500)

## 2020-09-19 LAB — T4, FREE: Free T4: 1.3 ng/dL (ref 0.82–1.77)

## 2020-10-15 ENCOUNTER — Ambulatory Visit: Payer: Managed Care, Other (non HMO) | Admitting: Internal Medicine

## 2020-11-01 ENCOUNTER — Ambulatory Visit
Admission: RE | Admit: 2020-11-01 | Discharge: 2020-11-01 | Disposition: A | Discharge status: Home / Self Care | Payer: Managed Care, Other (non HMO) | Source: Ambulatory Visit | Attending: Family Medicine | Admitting: Family Medicine

## 2020-11-01 ENCOUNTER — Other Ambulatory Visit: Payer: Self-pay

## 2020-11-01 ENCOUNTER — Other Ambulatory Visit: Payer: Self-pay | Admitting: Family Medicine

## 2020-11-01 DIAGNOSIS — N644 Mastodynia: Secondary | ICD-10-CM

## 2021-03-06 DIAGNOSIS — Z20822 Contact with and (suspected) exposure to covid-19: Secondary | ICD-10-CM | POA: Diagnosis not present

## 2021-03-08 DIAGNOSIS — R059 Cough, unspecified: Secondary | ICD-10-CM | POA: Diagnosis not present

## 2021-03-08 DIAGNOSIS — H6503 Acute serous otitis media, bilateral: Secondary | ICD-10-CM | POA: Diagnosis not present

## 2021-03-08 DIAGNOSIS — H6123 Impacted cerumen, bilateral: Secondary | ICD-10-CM | POA: Diagnosis not present

## 2021-03-08 DIAGNOSIS — K219 Gastro-esophageal reflux disease without esophagitis: Secondary | ICD-10-CM | POA: Diagnosis not present

## 2021-04-28 DIAGNOSIS — Z20822 Contact with and (suspected) exposure to covid-19: Secondary | ICD-10-CM | POA: Diagnosis not present

## 2021-05-07 DIAGNOSIS — J329 Chronic sinusitis, unspecified: Secondary | ICD-10-CM | POA: Diagnosis not present

## 2021-05-07 DIAGNOSIS — U071 COVID-19: Secondary | ICD-10-CM | POA: Diagnosis not present

## 2021-05-07 DIAGNOSIS — R051 Acute cough: Secondary | ICD-10-CM | POA: Diagnosis not present

## 2021-05-07 DIAGNOSIS — B9689 Other specified bacterial agents as the cause of diseases classified elsewhere: Secondary | ICD-10-CM | POA: Diagnosis not present

## 2021-07-13 DIAGNOSIS — J069 Acute upper respiratory infection, unspecified: Secondary | ICD-10-CM | POA: Diagnosis not present

## 2021-07-13 DIAGNOSIS — R070 Pain in throat: Secondary | ICD-10-CM | POA: Diagnosis not present

## 2021-07-13 DIAGNOSIS — R0981 Nasal congestion: Secondary | ICD-10-CM | POA: Diagnosis not present

## 2021-07-14 DIAGNOSIS — Z20822 Contact with and (suspected) exposure to covid-19: Secondary | ICD-10-CM | POA: Diagnosis not present

## 2021-07-14 DIAGNOSIS — J101 Influenza due to other identified influenza virus with other respiratory manifestations: Secondary | ICD-10-CM | POA: Diagnosis not present

## 2021-07-14 DIAGNOSIS — R059 Cough, unspecified: Secondary | ICD-10-CM | POA: Diagnosis not present

## 2021-07-14 DIAGNOSIS — R509 Fever, unspecified: Secondary | ICD-10-CM | POA: Diagnosis not present

## 2022-01-07 ENCOUNTER — Encounter: Payer: Self-pay | Admitting: *Deleted

## 2022-04-27 DIAGNOSIS — J40 Bronchitis, not specified as acute or chronic: Secondary | ICD-10-CM | POA: Diagnosis not present

## 2022-04-30 ENCOUNTER — Other Ambulatory Visit: Payer: Self-pay

## 2022-04-30 ENCOUNTER — Other Ambulatory Visit (HOSPITAL_BASED_OUTPATIENT_CLINIC_OR_DEPARTMENT_OTHER): Payer: Self-pay

## 2022-04-30 ENCOUNTER — Emergency Department (HOSPITAL_BASED_OUTPATIENT_CLINIC_OR_DEPARTMENT_OTHER)
Admission: EM | Admit: 2022-04-30 | Discharge: 2022-04-30 | Disposition: A | Discharge status: Home / Self Care | Payer: BC Managed Care – PPO | Attending: Emergency Medicine | Admitting: Emergency Medicine

## 2022-04-30 ENCOUNTER — Emergency Department (HOSPITAL_BASED_OUTPATIENT_CLINIC_OR_DEPARTMENT_OTHER): Payer: BC Managed Care – PPO | Admitting: Radiology

## 2022-04-30 DIAGNOSIS — R053 Chronic cough: Secondary | ICD-10-CM | POA: Diagnosis not present

## 2022-04-30 DIAGNOSIS — R059 Cough, unspecified: Secondary | ICD-10-CM | POA: Diagnosis not present

## 2022-04-30 DIAGNOSIS — R0602 Shortness of breath: Secondary | ICD-10-CM | POA: Diagnosis not present

## 2022-04-30 DIAGNOSIS — Z20822 Contact with and (suspected) exposure to covid-19: Secondary | ICD-10-CM | POA: Diagnosis not present

## 2022-04-30 LAB — BASIC METABOLIC PANEL
Anion gap: 13 (ref 5–15)
BUN: 15 mg/dL (ref 6–20)
CO2: 22 mmol/L (ref 22–32)
Calcium: 9.9 mg/dL (ref 8.9–10.3)
Chloride: 104 mmol/L (ref 98–111)
Creatinine, Ser: 0.59 mg/dL (ref 0.44–1.00)
GFR, Estimated: >60 mL/min (ref >60)
Glucose, Bld: 84 mg/dL (ref 70–99)
Potassium: 3.6 mmol/L (ref 3.5–5.1)
Sodium: 139 mmol/L (ref 135–145)

## 2022-04-30 LAB — CBC WITH DIFFERENTIAL/PLATELET
Abs Immature Granulocytes: 0.06 10*3/uL (ref 0.00–0.07)
Basophils Absolute: 0.1 10*3/uL (ref 0.0–0.1)
Basophils Relative: 0 %
Eosinophils Absolute: 0.1 10*3/uL (ref 0.0–0.5)
Eosinophils Relative: 1 %
HCT: 41.8 % (ref 36.0–46.0)
Hemoglobin: 13.8 g/dL (ref 12.0–15.0)
Immature Granulocytes: 1 %
Lymphocytes Relative: 39 %
Lymphs Abs: 4.6 10*3/uL — ABNORMAL HIGH (ref 0.7–4.0)
MCH: 28.5 pg (ref 26.0–34.0)
MCHC: 33 g/dL (ref 30.0–36.0)
MCV: 86.4 fL (ref 80.0–100.0)
Monocytes Absolute: 0.7 10*3/uL (ref 0.1–1.0)
Monocytes Relative: 6 %
Neutro Abs: 6.2 10*3/uL (ref 1.7–7.7)
Neutrophils Relative %: 53 %
Platelets: 308 10*3/uL (ref 150–400)
RBC: 4.84 MIL/uL (ref 3.87–5.11)
RDW: 14.1 % (ref 11.5–15.5)
WBC: 11.6 10*3/uL — ABNORMAL HIGH (ref 4.0–10.5)
nRBC: 0 % (ref 0.0–0.2)

## 2022-04-30 LAB — RESP PANEL BY RT-PCR (FLU A&B, COVID) ARPGX2
Influenza A by PCR: NEGATIVE
Influenza B by PCR: NEGATIVE
SARS Coronavirus 2 by RT PCR: NEGATIVE

## 2022-04-30 MED ORDER — BENZONATATE 100 MG PO CAPS
200.0000 mg | ORAL_CAPSULE | Freq: Once | ORAL | Status: AC
  Administered 2022-04-30: 200 mg via ORAL
  Filled 2022-04-30: qty 2

## 2022-04-30 MED ORDER — ALBUTEROL SULFATE HFA 108 (90 BASE) MCG/ACT IN AERS
1.0000 | INHALATION_SPRAY | RESPIRATORY_TRACT | 0 refills | Status: AC | PRN
  Filled 2022-04-30: qty 6.7, 28d supply, fill #0

## 2022-04-30 MED ORDER — ALBUTEROL SULFATE HFA 108 (90 BASE) MCG/ACT IN AERS
2.0000 | INHALATION_SPRAY | RESPIRATORY_TRACT | Status: DC | PRN
  Filled 2022-04-30: qty 6.7

## 2022-04-30 MED ORDER — BENZONATATE 100 MG PO CAPS
100.0000 mg | ORAL_CAPSULE | Freq: Three times a day (TID) | ORAL | 0 refills | Status: DC
  Filled 2022-04-30: qty 21, 7d supply, fill #0

## 2022-04-30 MED ORDER — ALBUTEROL SULFATE (2.5 MG/3ML) 0.083% IN NEBU
10.0000 mg | INHALATION_SOLUTION | Freq: Once | RESPIRATORY_TRACT | Status: AC
  Administered 2022-04-30: 10 mg via RESPIRATORY_TRACT

## 2022-04-30 MED ORDER — ALBUTEROL SULFATE (2.5 MG/3ML) 0.083% IN NEBU
5.0000 mg | INHALATION_SOLUTION | RESPIRATORY_TRACT | Status: DC
  Filled 2022-04-30: qty 6

## 2022-04-30 MED ORDER — AEROCHAMBER PLUS FLO-VU MEDIUM MISC
1.0000 | Status: DC | PRN
  Filled 2022-04-30: qty 1

## 2022-04-30 MED ORDER — ALBUTEROL SULFATE HFA 108 (90 BASE) MCG/ACT IN AERS
1.0000 | INHALATION_SPRAY | Freq: Four times a day (QID) | RESPIRATORY_TRACT | 0 refills | Status: DC | PRN
  Filled 2022-04-30: qty 1, fill #0

## 2022-04-30 NOTE — ED Provider Notes (Signed)
[facility] EMERGENCY DEPT Provider Note   CSN: [id number] Arrival date & time: 04/30/22  1055     History  Chief Complaint  Patient presents with   Shortness of Breath    Sherry Shepherd is a 36 y.o. female.  HPI     36 year old female comes in with chief complaint of shortness of breath.  Patient indicates that she has been having URI-like symptoms for the last 3 weeks.  She started with sore throat followed by congestion, cough.  The cough, chest tightness and the associated shortness of breath with that has lingered around for 3 weeks now.  She also has episodes of malaise and body aches.  She has done multiple home COVID-19 test which were negative.  She was seen at the urgent care last week, started on doxycycline and steroids, but her symptoms have persisted.  She indicates that she usually has few episodes of URI in a year, and when she gets them she stays sick for few days.  However in the past when she has received antibiotics or steroids, she has recovered swiftly.  Patient has past medical history of lichen sclerosis, gestational diabetes.  She is not a smoker.  Pt has no hx of PE, DVT and denies any exogenous hormone (testosterone / estrogen) use, long distance travels or surgery in the past 6 weeks, active cancer, recent immobilization.  Home Medications Prior to Admission medications   Medication Sig Start Date End Date Taking? Authorizing Provider  clobetasol ointment (TEMOVATE) 3.23 % Apply 1 application topically as needed.    [provider]  fluticasone (FLONASE) 50 MCG/ACT nasal spray Place 2 sprays into both nostrils daily.    [provider]  meloxicam (MOBIC) 15 MG tablet Take 15 mg by mouth daily as needed for pain.    [provider]  omeprazole (PRILOSEC) 40 MG capsule Take 1 capsule (40 mg total) by mouth daily. 12/08/18   Doran Stabler, MD      Allergies    Amoxicillin and Cefprozil    Review of Systems    Review of Systems  All other systems reviewed and are negative.   Physical Exam Updated Vital Signs BP (!) 124/95 (BP Location: Left Arm)   Pulse 79   Temp 98.3 F (36.8 C) (Oral)   Resp (!) 22   Ht '5\' 1"'$  (1.549 m)   Wt 70.8 kg   LMP 04/19/2022 (Exact Date)   SpO2 100%   BMI 29.48 kg/m  Physical Exam Vitals and nursing note reviewed.  Constitutional:      Appearance: She is well-developed.  HENT:     Head: Atraumatic.  Cardiovascular:     Rate and Rhythm: Normal rate.  Pulmonary:     Effort: Pulmonary effort is normal.     Breath sounds: No decreased breath sounds, wheezing, rhonchi or rales.  Musculoskeletal:     Cervical back: Normal range of motion and neck supple.  Skin:    General: Skin is warm and dry.  Neurological:     General: No focal deficit present.     Mental Status: She is alert and oriented to person, place, and time.     ED Results / Procedures / Treatments   Labs (all labs ordered are listed, but only abnormal results are displayed) Labs Reviewed  RESP PANEL BY RT-PCR (FLU A&B, COVID) [id number]  BASIC METABOLIC PANEL  CBC WITH DIFFERENTIAL/PLATELET    EKG EKG Interpretation  Date/Time:  Wednesday April 30 2022 11:10:49  EDT Ventricular Rate:  90 PR Interval:  132 QRS Duration: 82 QT Interval:  446 QTC Calculation: 545 R Axis:   36 Text Interpretation: Sinus rhythm with marked sinus arrhythmia Nonspecific T wave abnormality Prolonged QT Abnormal ECG No previous ECGs available No old tracing to compare Confirmed by Varney Biles [phone]) on 04/30/2022 3:14:27 PM  Radiology DG Chest 2 View  Result Date: 04/30/2022 CLINICAL DATA:  Cough, shortness of breath EXAM: CHEST - 2 VIEW COMPARISON:  10/29/2016 FINDINGS: The heart size and mediastinal contours are within normal limits. Both lungs are clear. The visualized skeletal structures are unremarkable. IMPRESSION: No active cardiopulmonary disease. Electronically Signed   By: Elmer Picker M.D.   On: 04/30/2022 16:08    Procedures Procedures    Medications Ordered in ED Medications  albuterol (VENTOLIN HFA) 108 (90 Base) MCG/ACT inhaler 2 puff (has no administration in time range)  AeroChamber Plus Flo-Vu Medium MISC 1 each (has no administration in time range)  benzonatate (TESSALON) capsule 200 mg (200 mg Oral Given 04/30/22 1617)  albuterol (PROVENTIL) (2.5 MG/3ML) 0.083% nebulizer solution 10 mg (10 mg Nebulization Given 04/30/22 1615)    ED Course/ Medical Decision Making/ A&P                           Medical Decision Making Amount and/or Complexity of Data Reviewed Labs: ordered. Radiology: ordered.  Risk Prescription drug management.   This patient presents to the ED with chief complaint(s) of shortness of breath and URI-like symptoms for the last 3 weeks with pertinent past medical history of recent urgent care visit, patient was started on doxycycline.she has been using Mucinex.  No overall improvement despite attempts at taking remedies.The complaint involves an extensive differential diagnosis and also carries with it a high risk of complications and morbidity.    The differential diagnosis includes : Post COVID syndrome, post viral syndrome. Patient has no concerning exposures, doubt bronchiectasis. Patient denies any concerning travels or environmental exposures, doubt Mycobacterium infection.  Clinically, it does not appear that patient has GERD as a cause and she is not on ACE inhibitor.  The initial plan is to get a chest x-ray to see if there is any evolution since she had the visit at the urgent care.  We will give her bronchodilator to see if there is component of bronchospasm that might get better with the nebulizer.  I think patient will need to see a pulmonologist or allergy/immunologist for further evaluation.  In her case, I think pulmonary team might be the better choice.   Additional history obtained:  Records reviewed Care  Everywhere/External Records reviewed records from urgent care visit at [facility].  Independent labs interpretation:  The following labs were independently interpreted: COVID-19, flu test is negative  Independent visualization and interpretation of imaging: - I independently visualized the following imaging with scope of interpretation limited to determining acute life threatening conditions related to emergency care: X-ray of the chest, which revealed no evidence of pneumonia  Treatment and Reassessment: Patient is EKG does have QT prolongation.  She is on doxycycline.  She is taking Mucinex -we will advise her to discontinue any medications that could have phenylephrine or pseudoephedrine.   Final Clinical Impression(s) / ED Diagnoses Final diagnoses:  Chronic cough    Rx / DC Orders ED Discharge Orders     None         Varney Biles, MD 04/30/22 1700

## 2022-04-30 NOTE — ED Triage Notes (Signed)
Pt arrived POV. Pt caox4 and ambulatory. Pt c/o SOB x3 weeks. Pt recently diagnosed with bronchitis on this past Sunday (9/24). Pt has been on doxycycline and steroid pack since with no improvement. Pt has taken multiple COVID home tests that have been negative. Afebrile.

## 2022-04-30 NOTE — Discharge Instructions (Addendum)
You were seen in the ER for persistent cough and chest tightness.  X-ray is reassuring.  Blood work is also reassuring. It is unclear to [place] why you are having this prolonged respiratory illness.  We think it would be best for you to see pulmonary doctor for further evaluation.  Take albuterol for severe chest tightness, cough, shortness of breath Tessalon Perles are given for cough.  Also consider purchasing hard candy like Halls.  We recommend that you discontinue Mucinex or any other over-the-counter cough medications.  As discussed, home remedies such as warm beverages, honey, salt water gargles should be continued.

## 2022-04-30 NOTE — ED Notes (Signed)
Patient arrived with cc of shortness of breath. Patient was diagnosed with bronchitis on 04/27/22 by an [facility]. Patient was given an antibiotic and steroid at that visit (per patient). Patient endorses a history of being diagnosed with bronchitis, but denies any other respiratory diagnoses. Patient does not smoke. Patient endorses several negative COVID tests and symptoms have been ongoing for approximately 2-3 weeks. Patient describes a varying productive cough with "stringy" or "chunks" of yellow/green or yellow/white mucus. RT assessment completed in triage.

## 2022-04-30 NOTE — ED Notes (Signed)
RT at bedside for neb treatment.

## 2022-05-06 ENCOUNTER — Ambulatory Visit: Payer: BC Managed Care – PPO | Admitting: Pulmonary Disease

## 2022-05-06 ENCOUNTER — Encounter: Payer: Self-pay | Admitting: Pulmonary Disease

## 2022-05-06 VITALS — BP 122/66 | HR 97 | Temp 98.6°F | Ht 61.0 in | Wt 155.0 lb

## 2022-05-06 DIAGNOSIS — J4540 Moderate persistent asthma, uncomplicated: Secondary | ICD-10-CM | POA: Diagnosis not present

## 2022-05-06 MED ORDER — BREZTRI AEROSPHERE 160-9-4.8 MCG/ACT IN AERO
2.0000 | INHALATION_SPRAY | Freq: Two times a day (BID) | RESPIRATORY_TRACT | 3 refills | Status: DC
  Filled 2022-05-09: qty 10.7, 30d supply, fill #0
  Filled 2022-06-16: qty 10.7, 30d supply, fill #1
  Filled 2022-08-15 (×2): qty 10.7, 30d supply, fill #2
  Filled 2022-10-05: qty 10.7, 30d supply, fill #3

## 2022-05-06 MED ORDER — SPACER/AERO-HOLDING CHAMBERS DEVI
2.0000 | Freq: Two times a day (BID) | 1 refills | Status: AC
  Filled 2022-06-20: qty 1, 30d supply, fill #0
  Filled 2022-08-15: qty 1, 1d supply, fill #0

## 2022-05-06 NOTE — Progress Notes (Signed)
$'@Patient'A$  ID: Sherry Shepherd, female    DOB: 09-18-1985, 36 y.o.   MRN: [id number]  Chief Complaint  Patient presents with   Consult    Pt is here for consult for SOB. Pt states she gets frequent res infections in the past. She states that this started 3.5 weeks ago. She tested at home and was covid negative. Went to urgent care for the SOB on 9/24. Pt was placed on prednisone/abt and it did seem to help.     Referring provider: Leeanne Rio, MD  HPI:   36 y.o. whom are seen in consultation for evaluation of recurrent bronchitis.  Most recent PCP note reviewed.  Recent ED note 04/2022 reviewed.  Patient reports recurrent episodes of bronchitis over the last 4 to 6 years.  Often triggered by what sounds like viral illness.  Husband, daughter with similar symptoms but resolved after a few days.  She has prolonged cough, occasional wheeze.  Lasting for a week to 2 or more.  Happens at least 2-3 times a year.  Usually responds well to prednisone, clears up symptoms.  Recent episode with more severe cough.  Also lasted longer.  Also new associated shortness of breath, chest tightness, feeling like there is fluid in her lungs.  She presented to the ED 04/2022.  Their chest x-ray PA and lateral reviewed interpret as clear lungs with evidence of mild hyperinflation on the lateral film.  Is compared to 2014 chest x-ray that on my review rotation reveals clear lungs and no signs of hyperinflation.  The worsening symptoms were despite prednisone.  She was given albuterol in the ED.  Subsequently with ongoing prednisone taper her symptoms have gradually improved.  The dyspnea or shortness of breath and chest tightness better.  Still with pretty bad cough.  Also with hoarse voice which is new.  She has a history of GERD and a hiatal hernia and based on upper endoscopy around 2020 per her report.  Her heartburn symptoms are well controlled on PPI.  PMH: Hashimoto's not on medication, GERD, hiatal  hernia Surgical history: Dental surgery Family history: Mother with diabetes, father with diabetes, hyperlipidemia Social history: Never smoker, lives in [place] / Pulmonary Flowsheets:   ACT:      No data to display          MMRC:     No data to display          Epworth:      No data to display          Tests:   FENO:  No results found for: "NITRICOXIDE"  PFT:     No data to display          WALK:      No data to display          Imaging: Personally reviewed and as per EMR discussion this note DG Chest 2 View  Result Date: 04/30/2022 CLINICAL DATA:  Cough, shortness of breath EXAM: CHEST - 2 VIEW COMPARISON:  10/29/2016 FINDINGS: The heart size and mediastinal contours are within normal limits. Both lungs are clear. The visualized skeletal structures are unremarkable. IMPRESSION: No active cardiopulmonary disease. Electronically Signed   By: Elmer Picker M.D.   On: 04/30/2022 16:08    Lab Results: Personally reviewed CBC    Component Value Date/Time   WBC 11.6 (H) 04/30/2022 1616   RBC 4.84 04/30/2022 1616   HGB 13.8 04/30/2022 1616   HCT 41.8 04/30/2022  1616   PLT 308 04/30/2022 1616   MCV 86.4 04/30/2022 1616   MCV 88.7 04/03/2013 0959   MCH 28.5 04/30/2022 1616   MCHC 33.0 04/30/2022 1616   RDW 14.1 04/30/2022 1616   LYMPHSABS 4.6 (H) 04/30/2022 1616   MONOABS 0.7 04/30/2022 1616   EOSABS 0.1 04/30/2022 1616   BASOSABS 0.1 04/30/2022 1616    BMET    Component Value Date/Time   NA 139 04/30/2022 1616   NA 138 09/18/2020 1148   K 3.6 04/30/2022 1616   CL 104 04/30/2022 1616   CO2 22 04/30/2022 1616   GLUCOSE 84 04/30/2022 1616   BUN 15 04/30/2022 1616   BUN 8 09/18/2020 1148   CREATININE 0.59 04/30/2022 1616   CREATININE 0.73 05/20/2016 0911   CALCIUM 9.9 04/30/2022 1616   GFRNONAA >60 04/30/2022 1616   GFRNONAA >89 05/20/2016 0911   GFRAA 112 09/18/2020 1148   GFRAA >89 05/20/2016 0911     BNP No results found for: "BNP"  ProBNP No results found for: "PROBNP"  Specialty Problems   None   Allergies  Allergen Reactions   Amoxicillin Hives   Cefprozil Hives    Immunization History  Administered Date(s) Administered   HPV Quadrivalent 10/07/2011   Influenza,inj,Quad PF,6+ Mos 05/20/2016, 09/13/2018, 04/14/2019   Influenza-Unspecified 05/20/2016, 06/04/2016, 04/14/2019, 05/18/2020   PFIZER(Purple Top)SARS-COV-2 Vaccination 08/16/2019, 09/06/2019, 05/10/2020   Tdap 06/05/2011, 03/20/2015    Past Medical History:  Diagnosis Date   Allergy    GERD (gastroesophageal reflux disease)    Gestational diabetes    Hx of varicella    Lichen sclerosus of female genitalia    Migraine     Tobacco History: Social History   Tobacco Use  Smoking Status Never  Smokeless Tobacco Never   Counseling given: Not Answered   Continue to not smoke  Outpatient Encounter Medications as of 05/06/2022  Medication Sig   albuterol (VENTOLIN HFA) 108 (90 Base) MCG/ACT inhaler Inhale 1-2 puffs into the lungs every 4 (four) hours as needed for shortness of breath (or cough).   benzonatate (TESSALON) 100 MG capsule Take 1 capsule (100 mg total) by mouth every 8 (eight) hours.   Budeson-Glycopyrrol-Formoterol (BREZTRI AEROSPHERE) 160-9-4.8 MCG/ACT AERO Inhale 2 puffs into the lungs in the morning and at bedtime.   clobetasol ointment (TEMOVATE) 7.61 % Apply 1 application topically as needed.   fluticasone (FLONASE) 50 MCG/ACT nasal spray Place 2 sprays into both nostrils daily.   meloxicam (MOBIC) 15 MG tablet Take 15 mg by mouth daily as needed for pain.   omeprazole (PRILOSEC) 40 MG capsule Take 1 capsule (40 mg total) by mouth daily.   Spacer/Aero-Holding Chambers DEVI 2 puffs by Does not apply route in the morning and at bedtime.   No facility-administered encounter medications on file as of 05/06/2022.     Review of Systems  Review of Systems  No chest pain with  exertion.  No orthopnea or PND.  Comprehensive review of systems otherwise negative. Physical Exam  BP 122/66 (BP Location: Right Arm, Patient Position: Sitting, Cuff Size: Normal)   Pulse 97   Temp 98.6 F (37 C) (Oral)   Ht '5\' 1"'$  (1.549 m)   Wt 155 lb (70.3 kg)   LMP 04/19/2022 (Exact Date)   SpO2 98%   BMI 29.29 kg/m   Wt Readings from Last 5 Encounters:  05/06/22 155 lb (70.3 kg)  04/30/22 156 lb (70.8 kg)  09/18/20 159 lb 9.6 oz (72.4 kg)  04/08/19 152 lb  9.6 oz (69.2 kg)  12/31/18 154 lb 4 oz (70 kg)    BMI Readings from Last 5 Encounters:  05/06/22 29.29 kg/m  04/30/22 29.48 kg/m  09/18/20 30.16 kg/m  04/08/19 28.83 kg/m  12/31/18 29.15 kg/m     Physical Exam General: Sitting in chair, no acute distress Eyes: EOMI, no icterus Neck: Supple, no JVP Pulmonary: Normal work of breathing, no wheeze, spasmodic cough with deep inhalation Abdomen: Nondistended, bowel sounds present Cardiovascular: Warm, no edema MSK: No synovitis, no joint effusion Neuro: Normal gait, no weakness Psych: Normal mood, full affect   Assessment & Plan:   Recurrent bronchitis: Multiple episodes over the last 46 years requiring prednisone therapy.  Prolonged cough etc. after viral illness.  Recently with the association of chest discomfort, heaviness, shortness of breath.  High suspicion for asthma.  Moderate persistent asthma: Symptoms primarily recurrent bronchitis.  Minimal symptoms in between.  Possibly hiatal hernia and GERD contributing to poor control historically.  Due to multiple episodes requiring prednisone over the last few years, triple inhaled therapy today.  Breztri prescribed.  Hyperinflation on chest x-ray: On the lateral film, concern for obstruction.  Breztri as above for dual bronchodilators.   Return in about 3 months (around 08/06/2022).   Lanier Clam, MD 05/06/2022

## 2022-05-06 NOTE — Patient Instructions (Addendum)
Nice to meet you  Based on the description of your symptoms, the recurrent nature of the episodes, and in general good response to prednisone as well as some findings on your chest x-ray, most likely diagnosis is asthma.  To aggressively treat this and use Breztri 2 puffs twice a day.  Use with a spacer.  I prescribed both today.  Rinse your mouth out with water after every use.  Please let me know if there is any issues with the medication or any concerns with how you are feeling, symptoms, etc.  Return to clinic in 3 months or sooner as needed with Dr. Silas Flood

## 2022-05-09 ENCOUNTER — Other Ambulatory Visit (HOSPITAL_BASED_OUTPATIENT_CLINIC_OR_DEPARTMENT_OTHER): Payer: Self-pay

## 2022-05-28 ENCOUNTER — Other Ambulatory Visit (HOSPITAL_BASED_OUTPATIENT_CLINIC_OR_DEPARTMENT_OTHER): Payer: Self-pay

## 2022-05-28 DIAGNOSIS — D485 Neoplasm of uncertain behavior of skin: Secondary | ICD-10-CM | POA: Diagnosis not present

## 2022-05-28 DIAGNOSIS — D2239 Melanocytic nevi of other parts of face: Secondary | ICD-10-CM | POA: Diagnosis not present

## 2022-05-28 DIAGNOSIS — L245 Irritant contact dermatitis due to other chemical products: Secondary | ICD-10-CM | POA: Diagnosis not present

## 2022-05-28 DIAGNOSIS — D225 Melanocytic nevi of trunk: Secondary | ICD-10-CM | POA: Diagnosis not present

## 2022-05-28 DIAGNOSIS — L700 Acne vulgaris: Secondary | ICD-10-CM | POA: Diagnosis not present

## 2022-05-28 MED ORDER — CLINDAMYCIN PHOSPHATE 1 % EX LOTN
TOPICAL_LOTION | CUTANEOUS | 5 refills | Status: DC
  Filled 2022-05-28: qty 60, 30d supply, fill #0
  Filled 2022-08-15 (×2): qty 60, 30d supply, fill #1

## 2022-05-28 MED ORDER — CLOBETASOL PROPIONATE 0.05 % EX OINT
TOPICAL_OINTMENT | CUTANEOUS | 1 refills | Status: DC
  Filled 2022-05-28: qty 60, 30d supply, fill #0

## 2022-06-16 ENCOUNTER — Other Ambulatory Visit (HOSPITAL_BASED_OUTPATIENT_CLINIC_OR_DEPARTMENT_OTHER): Payer: Self-pay

## 2022-06-20 ENCOUNTER — Other Ambulatory Visit (HOSPITAL_BASED_OUTPATIENT_CLINIC_OR_DEPARTMENT_OTHER): Payer: Self-pay

## 2022-07-04 ENCOUNTER — Other Ambulatory Visit (HOSPITAL_BASED_OUTPATIENT_CLINIC_OR_DEPARTMENT_OTHER): Payer: Self-pay

## 2022-08-15 ENCOUNTER — Other Ambulatory Visit (HOSPITAL_BASED_OUTPATIENT_CLINIC_OR_DEPARTMENT_OTHER): Payer: Self-pay

## 2022-08-15 ENCOUNTER — Other Ambulatory Visit: Payer: Self-pay

## 2022-08-16 ENCOUNTER — Other Ambulatory Visit (HOSPITAL_BASED_OUTPATIENT_CLINIC_OR_DEPARTMENT_OTHER): Payer: Self-pay

## 2022-08-21 ENCOUNTER — Other Ambulatory Visit (HOSPITAL_BASED_OUTPATIENT_CLINIC_OR_DEPARTMENT_OTHER): Payer: Self-pay

## 2022-08-21 MED ORDER — COMIRNATY 30 MCG/0.3ML IM SUSY
PREFILLED_SYRINGE | INTRAMUSCULAR | 0 refills | Status: DC
  Filled 2022-08-21: qty 0.3, 1d supply, fill #0

## 2022-08-22 ENCOUNTER — Other Ambulatory Visit (HOSPITAL_BASED_OUTPATIENT_CLINIC_OR_DEPARTMENT_OTHER): Payer: Self-pay

## 2022-08-26 DIAGNOSIS — Z3181 Encounter for male factor infertility in female patient: Secondary | ICD-10-CM | POA: Diagnosis not present

## 2022-08-26 DIAGNOSIS — Z3169 Encounter for other general counseling and advice on procreation: Secondary | ICD-10-CM | POA: Diagnosis not present

## 2022-08-27 DIAGNOSIS — E559 Vitamin D deficiency, unspecified: Secondary | ICD-10-CM | POA: Diagnosis not present

## 2022-08-27 DIAGNOSIS — Z319 Encounter for procreative management, unspecified: Secondary | ICD-10-CM | POA: Diagnosis not present

## 2022-08-28 ENCOUNTER — Other Ambulatory Visit (HOSPITAL_BASED_OUTPATIENT_CLINIC_OR_DEPARTMENT_OTHER): Payer: Self-pay

## 2022-08-28 DIAGNOSIS — Z6829 Body mass index (BMI) 29.0-29.9, adult: Secondary | ICD-10-CM | POA: Diagnosis not present

## 2022-08-28 DIAGNOSIS — Z01419 Encounter for gynecological examination (general) (routine) without abnormal findings: Secondary | ICD-10-CM | POA: Diagnosis not present

## 2022-08-28 DIAGNOSIS — Z124 Encounter for screening for malignant neoplasm of cervix: Secondary | ICD-10-CM | POA: Diagnosis not present

## 2022-08-28 LAB — HM PAP SMEAR

## 2022-08-28 LAB — RESULTS CONSOLE HPV: CHL HPV: NEGATIVE

## 2022-08-28 MED ORDER — ESTRADIOL 0.1 MG/GM VA CREA
TOPICAL_CREAM | VAGINAL | 4 refills | Status: DC
  Filled 2022-08-28: qty 42.5, 90d supply, fill #0

## 2022-08-28 MED ORDER — CLOBETASOL PROPIONATE 0.05 % EX OINT
TOPICAL_OINTMENT | CUTANEOUS | 2 refills | Status: DC
  Filled 2022-08-28: qty 60, 30d supply, fill #0

## 2022-08-29 ENCOUNTER — Other Ambulatory Visit (HOSPITAL_BASED_OUTPATIENT_CLINIC_OR_DEPARTMENT_OTHER): Payer: Self-pay

## 2022-09-01 ENCOUNTER — Ambulatory Visit: Payer: BC Managed Care – PPO | Admitting: Family Medicine

## 2022-09-04 ENCOUNTER — Other Ambulatory Visit (HOSPITAL_BASED_OUTPATIENT_CLINIC_OR_DEPARTMENT_OTHER): Payer: Self-pay

## 2022-09-04 DIAGNOSIS — Z3169 Encounter for other general counseling and advice on procreation: Secondary | ICD-10-CM | POA: Diagnosis not present

## 2022-09-04 DIAGNOSIS — Z3181 Encounter for male factor infertility in female patient: Secondary | ICD-10-CM | POA: Diagnosis not present

## 2022-09-04 MED ORDER — LEVOTHYROXINE SODIUM 25 MCG PO TABS
25.0000 ug | ORAL_TABLET | Freq: Every day | ORAL | 3 refills | Status: DC
  Filled 2022-09-04: qty 30, 30d supply, fill #0
  Filled 2022-09-28: qty 30, 30d supply, fill #1
  Filled 2022-10-31: qty 30, 30d supply, fill #2
  Filled 2022-12-02: qty 30, 30d supply, fill #3

## 2022-09-04 MED ORDER — VITAMIN D3 1.25 MG (50000 UT) PO CAPS
1.2500 mg | ORAL_CAPSULE | ORAL | 0 refills | Status: DC
  Filled 2022-09-04: qty 12, 84d supply, fill #0

## 2022-09-05 ENCOUNTER — Other Ambulatory Visit (HOSPITAL_BASED_OUTPATIENT_CLINIC_OR_DEPARTMENT_OTHER): Payer: Self-pay

## 2022-09-15 ENCOUNTER — Ambulatory Visit (INDEPENDENT_AMBULATORY_CARE_PROVIDER_SITE_OTHER): Payer: BC Managed Care – PPO | Admitting: Family Medicine

## 2022-09-15 ENCOUNTER — Other Ambulatory Visit: Payer: Self-pay

## 2022-09-15 ENCOUNTER — Encounter: Payer: Self-pay | Admitting: Family Medicine

## 2022-09-15 ENCOUNTER — Ambulatory Visit (HOSPITAL_COMMUNITY)
Admission: RE | Admit: 2022-09-15 | Discharge: 2022-09-15 | Disposition: A | Discharge status: Home / Self Care | Payer: BC Managed Care – PPO | Source: Ambulatory Visit | Attending: Family Medicine | Admitting: Family Medicine

## 2022-09-15 VITALS — BP 110/80 | HR 80 | Ht 61.0 in | Wt 153.6 lb

## 2022-09-15 DIAGNOSIS — I4581 Long QT syndrome: Secondary | ICD-10-CM | POA: Insufficient documentation

## 2022-09-15 DIAGNOSIS — E063 Autoimmune thyroiditis: Secondary | ICD-10-CM | POA: Diagnosis not present

## 2022-09-15 DIAGNOSIS — R9431 Abnormal electrocardiogram [ECG] [EKG]: Secondary | ICD-10-CM

## 2022-09-15 DIAGNOSIS — K219 Gastro-esophageal reflux disease without esophagitis: Secondary | ICD-10-CM | POA: Diagnosis not present

## 2022-09-15 DIAGNOSIS — Z0000 Encounter for general adult medical examination without abnormal findings: Secondary | ICD-10-CM

## 2022-09-15 DIAGNOSIS — Z01810 Encounter for preprocedural cardiovascular examination: Secondary | ICD-10-CM | POA: Diagnosis not present

## 2022-09-15 NOTE — Assessment & Plan Note (Signed)
Ordered thyroid ultrasound for patient.  Advised I am happy to manage and adjust her levothyroxine as needed outside of pregnancy.

## 2022-09-15 NOTE — Assessment & Plan Note (Signed)
Doing well with PPI.  Continue.

## 2022-09-15 NOTE — Patient Instructions (Signed)
It was great to see you again today.  Ordered ultrasound of thyroid Labs today - kidneys, liver, cholesterol, A1c  Good luck with everything!!  Be well, Dr. Ardelia Mems

## 2022-09-15 NOTE — Progress Notes (Signed)
  Date of Visit: 09/15/2022   SUBJECTIVE:   HPI:  Sherry Shepherd presents today for a well woman exam.   Concerns today: Working with fertility doctor, likely doing IUI, thyroid, abnormal EKG, see below Pap smear status: recently had at OBGYN office Exercise: walks regularly Smoking: no Alcohol: no Drugs: no Mood: overall doing well Dentist: admits needs to go again soon Cancers in family: none  Thyroiditis: Reports longstanding history of Hashimoto's thyroiditis.  Recently was started on levothyroxine 25 mcg daily in preparation for trying to conceive.  Has not been to endocrinologist in several years, and knows she is past due for thyroid ultrasound.  Abnormal EKG: Was seen in the emergency room for cough, had EKG done which initially showed prolonged QT.  She was advised to have this repeated at some point.   OBJECTIVE:   BP 110/80   Pulse 80   Ht '5\' 1"'$  (1.549 m)   Wt 153 lb 9.6 oz (69.7 kg)   LMP 09/06/2022 (Exact Date)   SpO2 98%   BMI 29.02 kg/m  Gen: NAD, pleasant, cooperative HEENT: NCAT, + palpable thyromegaly without discrete nodules, no anterior cervical lymphadenopathy Heart: RRR, no murmurs Lungs: CTAB, NWOB Abdomen: soft, nontender to palpation Neuro: grossly nonfocal, speech normal  ASSESSMENT/PLAN:   Health maintenance:  -pap smear: Request records from OB/GYN office -mammogram: Begin age 61 -lipid screening: Check lipids today along with A1c and CMET -immunizations:  Flu: Up-to-date Tdap: Up-to-date COVID: Up-to-date HPV: discussed option of completing series as she did not have the full 3 doses (appears just had 1). She will check with insurance as well as REI team -handout given on health maintenance topics  Hashimoto's thyroiditis Ordered thyroid ultrasound for patient.  Advised I am happy to manage and adjust her levothyroxine as needed outside of pregnancy.  Gastroesophageal reflux disease Doing well with PPI.  Continue.  EKG repeated today  and is normal.  No further workup.  FOLLOW UP: Follow up in 1 year for next CPE  [place] J. Ardelia Mems, [place]

## 2022-09-16 LAB — CMP14+EGFR
ALT: 18 IU/L (ref 0–32)
AST: 18 IU/L (ref 0–40)
Albumin/Globulin Ratio: 2.1 (ref 1.2–2.2)
Albumin: 4.8 g/dL (ref 3.9–4.9)
Alkaline Phosphatase: 72 IU/L (ref 44–121)
BUN/Creatinine Ratio: 10 (ref 9–23)
BUN: 8 mg/dL (ref 6–20)
Bilirubin Total: 0.5 mg/dL (ref 0.0–1.2)
CO2: 23 mmol/L (ref 20–29)
Calcium: 9.3 mg/dL (ref 8.7–10.2)
Chloride: 102 mmol/L (ref 96–106)
Creatinine, Ser: 0.79 mg/dL (ref 0.57–1.00)
Globulin, Total: 2.3 g/dL (ref 1.5–4.5)
Glucose: 78 mg/dL (ref 70–99)
Potassium: 4.1 mmol/L (ref 3.5–5.2)
Sodium: 139 mmol/L (ref 134–144)
Total Protein: 7.1 g/dL (ref 6.0–8.5)
eGFR: 99 mL/min/{1.73_m2} (ref >59)

## 2022-09-16 LAB — LIPID PANEL
Chol/HDL Ratio: 4 ratio (ref 0.0–4.4)
Cholesterol, Total: 200 mg/dL — ABNORMAL HIGH (ref 100–199)
HDL: 50 mg/dL (ref >39)
LDL Chol Calc (NIH): 119 mg/dL — ABNORMAL HIGH (ref 0–99)
Triglycerides: 175 mg/dL — ABNORMAL HIGH (ref 0–149)
VLDL Cholesterol Cal: 31 mg/dL (ref 5–40)

## 2022-09-17 ENCOUNTER — Other Ambulatory Visit (HOSPITAL_BASED_OUTPATIENT_CLINIC_OR_DEPARTMENT_OTHER): Payer: Self-pay

## 2022-09-17 DIAGNOSIS — Z3169 Encounter for other general counseling and advice on procreation: Secondary | ICD-10-CM | POA: Diagnosis not present

## 2022-09-17 DIAGNOSIS — Z319 Encounter for procreative management, unspecified: Secondary | ICD-10-CM | POA: Diagnosis not present

## 2022-09-17 DIAGNOSIS — Z3181 Encounter for male factor infertility in female patient: Secondary | ICD-10-CM | POA: Diagnosis not present

## 2022-09-17 MED ORDER — LETROZOLE 2.5 MG PO TABS
ORAL_TABLET | ORAL | 2 refills | Status: DC
  Filled 2022-09-17: qty 15, 5d supply, fill #0
  Filled 2022-10-13: qty 15, 5d supply, fill #1
  Filled 2022-11-12: qty 15, 5d supply, fill #2

## 2022-09-25 DIAGNOSIS — Z3141 Encounter for fertility testing: Secondary | ICD-10-CM | POA: Diagnosis not present

## 2022-09-29 DIAGNOSIS — Z3189 Encounter for other procreative management: Secondary | ICD-10-CM | POA: Diagnosis not present

## 2022-10-05 ENCOUNTER — Encounter: Payer: Self-pay | Admitting: Family Medicine

## 2022-10-06 ENCOUNTER — Other Ambulatory Visit (HOSPITAL_BASED_OUTPATIENT_CLINIC_OR_DEPARTMENT_OTHER): Payer: Self-pay

## 2022-10-08 ENCOUNTER — Other Ambulatory Visit (HOSPITAL_BASED_OUTPATIENT_CLINIC_OR_DEPARTMENT_OTHER): Payer: Self-pay

## 2022-10-13 ENCOUNTER — Other Ambulatory Visit (HOSPITAL_BASED_OUTPATIENT_CLINIC_OR_DEPARTMENT_OTHER): Payer: Self-pay

## 2022-10-16 ENCOUNTER — Other Ambulatory Visit (HOSPITAL_BASED_OUTPATIENT_CLINIC_OR_DEPARTMENT_OTHER): Payer: Self-pay

## 2022-10-23 DIAGNOSIS — Z3189 Encounter for other procreative management: Secondary | ICD-10-CM | POA: Diagnosis not present

## 2022-10-25 DIAGNOSIS — Z3189 Encounter for other procreative management: Secondary | ICD-10-CM | POA: Diagnosis not present

## 2022-10-28 DIAGNOSIS — N760 Acute vaginitis: Secondary | ICD-10-CM | POA: Diagnosis not present

## 2022-11-04 ENCOUNTER — Other Ambulatory Visit (HOSPITAL_BASED_OUTPATIENT_CLINIC_OR_DEPARTMENT_OTHER): Payer: Self-pay

## 2022-11-07 DIAGNOSIS — K6289 Other specified diseases of anus and rectum: Secondary | ICD-10-CM | POA: Diagnosis not present

## 2022-11-20 DIAGNOSIS — Z3189 Encounter for other procreative management: Secondary | ICD-10-CM | POA: Diagnosis not present

## 2022-11-24 DIAGNOSIS — Z3189 Encounter for other procreative management: Secondary | ICD-10-CM | POA: Diagnosis not present

## 2022-12-08 ENCOUNTER — Other Ambulatory Visit (HOSPITAL_BASED_OUTPATIENT_CLINIC_OR_DEPARTMENT_OTHER): Payer: Self-pay

## 2022-12-08 MED ORDER — LEVOTHYROXINE SODIUM 25 MCG PO TABS
25.0000 ug | ORAL_TABLET | Freq: Every day | ORAL | 3 refills | Status: DC
  Filled 2023-01-02: qty 30, 30d supply, fill #0
  Filled 2023-02-02: qty 30, 30d supply, fill #1
  Filled 2023-03-03: qty 30, 30d supply, fill #2
  Filled 2023-03-30: qty 30, 30d supply, fill #3

## 2022-12-18 ENCOUNTER — Other Ambulatory Visit (HOSPITAL_BASED_OUTPATIENT_CLINIC_OR_DEPARTMENT_OTHER): Payer: Self-pay

## 2022-12-18 DIAGNOSIS — E288 Other ovarian dysfunction: Secondary | ICD-10-CM | POA: Diagnosis not present

## 2022-12-18 DIAGNOSIS — Z3183 Encounter for assisted reproductive fertility procedure cycle: Secondary | ICD-10-CM | POA: Diagnosis not present

## 2022-12-18 DIAGNOSIS — N711 Chronic inflammatory disease of uterus: Secondary | ICD-10-CM | POA: Diagnosis not present

## 2022-12-18 DIAGNOSIS — Z3141 Encounter for fertility testing: Secondary | ICD-10-CM | POA: Diagnosis not present

## 2022-12-18 DIAGNOSIS — Z113 Encounter for screening for infections with a predominantly sexual mode of transmission: Secondary | ICD-10-CM | POA: Diagnosis not present

## 2022-12-18 MED ORDER — DOXYCYCLINE HYCLATE 100 MG PO CAPS
100.0000 mg | ORAL_CAPSULE | Freq: Two times a day (BID) | ORAL | 2 refills | Status: DC
  Filled 2022-12-18: qty 10, 5d supply, fill #0

## 2022-12-20 DIAGNOSIS — Z319 Encounter for procreative management, unspecified: Secondary | ICD-10-CM | POA: Diagnosis not present

## 2022-12-22 ENCOUNTER — Other Ambulatory Visit (HOSPITAL_BASED_OUTPATIENT_CLINIC_OR_DEPARTMENT_OTHER): Payer: Self-pay

## 2022-12-22 MED ORDER — ESTRADIOL 0.1 MG/24HR TD PTTW
MEDICATED_PATCH | TRANSDERMAL | 3 refills | Status: DC
  Filled 2022-12-22: qty 8, 28d supply, fill #0
  Filled 2023-02-03: qty 8, 28d supply, fill #1
  Filled 2023-05-06 – 2023-05-08 (×2): qty 8, 28d supply, fill #2

## 2023-01-02 ENCOUNTER — Other Ambulatory Visit (HOSPITAL_BASED_OUTPATIENT_CLINIC_OR_DEPARTMENT_OTHER): Payer: Self-pay

## 2023-01-02 MED ORDER — DOXYCYCLINE HYCLATE 100 MG PO CAPS
100.0000 mg | ORAL_CAPSULE | Freq: Two times a day (BID) | ORAL | 2 refills | Status: DC
  Filled 2023-01-02: qty 40, 20d supply, fill #0

## 2023-01-08 ENCOUNTER — Other Ambulatory Visit (HOSPITAL_BASED_OUTPATIENT_CLINIC_OR_DEPARTMENT_OTHER): Payer: Self-pay

## 2023-01-08 MED ORDER — NORETHINDRONE ACETATE 5 MG PO TABS
5.0000 mg | ORAL_TABLET | Freq: Every morning | ORAL | 0 refills | Status: DC
  Filled 2023-01-08: qty 5, 5d supply, fill #0

## 2023-01-16 ENCOUNTER — Other Ambulatory Visit (HOSPITAL_BASED_OUTPATIENT_CLINIC_OR_DEPARTMENT_OTHER): Payer: Self-pay

## 2023-01-16 MED ORDER — "BD DISP NEEDLES 27G X 1/2"" MISC"
0 refills | Status: DC
  Filled 2023-01-16: qty 15, 15d supply, fill #0

## 2023-01-20 DIAGNOSIS — N979 Female infertility, unspecified: Secondary | ICD-10-CM | POA: Diagnosis not present

## 2023-01-20 DIAGNOSIS — Z319 Encounter for procreative management, unspecified: Secondary | ICD-10-CM | POA: Diagnosis not present

## 2023-02-16 ENCOUNTER — Other Ambulatory Visit (HOSPITAL_BASED_OUTPATIENT_CLINIC_OR_DEPARTMENT_OTHER): Payer: Self-pay

## 2023-02-16 MED ORDER — DOXYCYCLINE HYCLATE 100 MG PO CAPS
100.0000 mg | ORAL_CAPSULE | Freq: Two times a day (BID) | ORAL | 2 refills | Status: DC
  Filled 2023-02-16: qty 10, 5d supply, fill #0

## 2023-02-24 ENCOUNTER — Other Ambulatory Visit (HOSPITAL_BASED_OUTPATIENT_CLINIC_OR_DEPARTMENT_OTHER): Payer: Self-pay

## 2023-02-24 DIAGNOSIS — Z319 Encounter for procreative management, unspecified: Secondary | ICD-10-CM | POA: Diagnosis not present

## 2023-02-24 DIAGNOSIS — Z3141 Encounter for fertility testing: Secondary | ICD-10-CM | POA: Diagnosis not present

## 2023-02-24 DIAGNOSIS — N979 Female infertility, unspecified: Secondary | ICD-10-CM | POA: Diagnosis not present

## 2023-02-24 MED ORDER — DOXYCYCLINE HYCLATE 100 MG PO CAPS
100.0000 mg | ORAL_CAPSULE | Freq: Two times a day (BID) | ORAL | 2 refills | Status: DC
  Filled 2023-02-24: qty 10, 5d supply, fill #0

## 2023-02-24 MED ORDER — NORETHINDRONE ACETATE 5 MG PO TABS
5.0000 mg | ORAL_TABLET | Freq: Every morning | ORAL | 0 refills | Status: DC
  Filled 2023-02-24: qty 5, 5d supply, fill #0

## 2023-03-30 ENCOUNTER — Other Ambulatory Visit: Payer: Self-pay

## 2023-03-30 ENCOUNTER — Other Ambulatory Visit (HOSPITAL_BASED_OUTPATIENT_CLINIC_OR_DEPARTMENT_OTHER): Payer: Self-pay

## 2023-03-30 MED ORDER — "BD LUER-LOK SYRINGE 18G X 1-1/2"" 3 ML MISC"
1.0000 | Freq: Every day | 3 refills | Status: DC
  Filled 2023-03-30: qty 30, 30d supply, fill #0

## 2023-03-30 MED ORDER — ESTRADIOL 2 MG PO TABS
2.0000 mg | ORAL_TABLET | Freq: Two times a day (BID) | ORAL | 3 refills | Status: DC
  Filled 2023-03-30: qty 60, 30d supply, fill #0
  Filled 2023-05-06 – 2023-05-08 (×2): qty 60, 30d supply, fill #1

## 2023-03-30 MED ORDER — "NEEDLE (DISP) 23G X 1"" MISC"
1.0000 | Freq: Every day | 3 refills | Status: DC
  Filled 2023-03-30 – 2023-03-31 (×2): qty 30, 30d supply, fill #0

## 2023-03-30 MED ORDER — ESTRADIOL 0.1 MG/24HR TD PTTW
1.0000 | MEDICATED_PATCH | TRANSDERMAL | 3 refills | Status: DC
  Filled 2023-03-30: qty 8, 24d supply, fill #0

## 2023-03-30 MED ORDER — PROGESTERONE 50 MG/ML IM OIL
50.0000 mg | TOPICAL_OIL | Freq: Every day | INTRAMUSCULAR | 3 refills | Status: DC
  Filled 2023-03-30: qty 30, 30d supply, fill #0
  Filled 2023-05-20 – 2023-05-29 (×3): qty 30, 30d supply, fill #1

## 2023-03-30 MED ORDER — METHYLPREDNISOLONE 8 MG PO TABS
8.0000 mg | ORAL_TABLET | Freq: Two times a day (BID) | ORAL | 1 refills | Status: DC
  Filled 2023-03-30: qty 8, 4d supply, fill #0

## 2023-03-31 ENCOUNTER — Other Ambulatory Visit (HOSPITAL_BASED_OUTPATIENT_CLINIC_OR_DEPARTMENT_OTHER): Payer: Self-pay

## 2023-03-31 ENCOUNTER — Other Ambulatory Visit: Payer: Self-pay

## 2023-04-03 ENCOUNTER — Other Ambulatory Visit (HOSPITAL_BASED_OUTPATIENT_CLINIC_OR_DEPARTMENT_OTHER): Payer: Self-pay

## 2023-04-03 MED ORDER — "NEEDLE (DISP) 22G X 1-1/2"" MISC": 3 refills | Status: DC

## 2023-04-29 ENCOUNTER — Other Ambulatory Visit (HOSPITAL_BASED_OUTPATIENT_CLINIC_OR_DEPARTMENT_OTHER): Payer: Self-pay

## 2023-04-29 MED ORDER — LEVOTHYROXINE SODIUM 25 MCG PO TABS
ORAL_TABLET | ORAL | 6 refills | Status: DC
  Filled 2023-04-29: qty 38, 30d supply, fill #0
  Filled 2023-05-20: qty 38, 30d supply, fill #1
  Filled 2023-07-06: qty 38, 30d supply, fill #2
  Filled 2023-08-12: qty 38, 30d supply, fill #3
  Filled 2023-09-16: qty 38, 30d supply, fill #4
  Filled 2023-10-26: qty 38, 30d supply, fill #5
  Filled 2023-11-16 – 2023-11-18 (×2): qty 38, 30d supply, fill #6

## 2023-05-07 ENCOUNTER — Other Ambulatory Visit: Payer: Self-pay

## 2023-05-08 ENCOUNTER — Other Ambulatory Visit: Payer: Self-pay

## 2023-05-08 ENCOUNTER — Other Ambulatory Visit (HOSPITAL_BASED_OUTPATIENT_CLINIC_OR_DEPARTMENT_OTHER): Payer: Self-pay

## 2023-05-18 ENCOUNTER — Other Ambulatory Visit (HOSPITAL_BASED_OUTPATIENT_CLINIC_OR_DEPARTMENT_OTHER): Payer: Self-pay

## 2023-05-18 MED ORDER — PROGESTERONE 200 MG PO CAPS
200.0000 mg | ORAL_CAPSULE | Freq: Every day | ORAL | 0 refills | Status: DC
  Filled 2023-05-18 – 2023-06-05 (×2): qty 14, 14d supply, fill #0

## 2023-05-21 ENCOUNTER — Other Ambulatory Visit (HOSPITAL_BASED_OUTPATIENT_CLINIC_OR_DEPARTMENT_OTHER): Payer: Self-pay

## 2023-05-21 ENCOUNTER — Other Ambulatory Visit: Payer: Self-pay

## 2023-05-28 ENCOUNTER — Other Ambulatory Visit: Payer: Self-pay

## 2023-05-28 ENCOUNTER — Other Ambulatory Visit (HOSPITAL_BASED_OUTPATIENT_CLINIC_OR_DEPARTMENT_OTHER): Payer: Self-pay

## 2023-05-29 ENCOUNTER — Other Ambulatory Visit (HOSPITAL_BASED_OUTPATIENT_CLINIC_OR_DEPARTMENT_OTHER): Payer: Self-pay

## 2023-06-05 ENCOUNTER — Other Ambulatory Visit (HOSPITAL_BASED_OUTPATIENT_CLINIC_OR_DEPARTMENT_OTHER): Payer: Self-pay

## 2023-06-08 ENCOUNTER — Other Ambulatory Visit (HOSPITAL_BASED_OUTPATIENT_CLINIC_OR_DEPARTMENT_OTHER): Payer: Self-pay

## 2023-06-08 LAB — OB RESULTS CONSOLE GC/CHLAMYDIA
Chlamydia: NEGATIVE
Neisseria Gonorrhea: NEGATIVE

## 2023-06-08 LAB — HEPATITIS C ANTIBODY: HCV Ab: NEGATIVE

## 2023-06-08 LAB — OB RESULTS CONSOLE ANTIBODY SCREEN: Antibody Screen: NEGATIVE

## 2023-06-08 LAB — OB RESULTS CONSOLE HIV ANTIBODY (ROUTINE TESTING): HIV: NONREACTIVE

## 2023-06-08 LAB — OB RESULTS CONSOLE RPR: RPR: NONREACTIVE

## 2023-06-08 LAB — OB RESULTS CONSOLE RUBELLA ANTIBODY, IGM: Rubella: IMMUNE

## 2023-06-08 LAB — OB RESULTS CONSOLE HEPATITIS B SURFACE ANTIGEN: Hepatitis B Surface Ag: NEGATIVE

## 2023-06-08 MED ORDER — CLOBETASOL PROPIONATE 0.05 % EX OINT
TOPICAL_OINTMENT | CUTANEOUS | 2 refills | Status: DC
  Filled 2023-06-08: qty 60, 30d supply, fill #0

## 2023-06-09 ENCOUNTER — Other Ambulatory Visit (HOSPITAL_BASED_OUTPATIENT_CLINIC_OR_DEPARTMENT_OTHER): Payer: Self-pay

## 2023-06-10 ENCOUNTER — Other Ambulatory Visit (HOSPITAL_BASED_OUTPATIENT_CLINIC_OR_DEPARTMENT_OTHER): Payer: Self-pay

## 2023-06-10 MED ORDER — FLULAVAL 0.5 ML IM SUSY
PREFILLED_SYRINGE | INTRAMUSCULAR | 0 refills | Status: DC
  Filled 2023-06-10: qty 0.5, 1d supply, fill #0

## 2023-06-19 ENCOUNTER — Encounter: Payer: Self-pay | Admitting: Family Medicine

## 2023-07-06 ENCOUNTER — Other Ambulatory Visit (HOSPITAL_BASED_OUTPATIENT_CLINIC_OR_DEPARTMENT_OTHER): Payer: Self-pay

## 2023-07-14 DIAGNOSIS — O09812 Supervision of pregnancy resulting from assisted reproductive technology, second trimester: Secondary | ICD-10-CM | POA: Insufficient documentation

## 2023-07-15 ENCOUNTER — Other Ambulatory Visit: Payer: Self-pay | Admitting: Obstetrics and Gynecology

## 2023-07-15 ENCOUNTER — Encounter: Payer: Self-pay | Admitting: Obstetrics and Gynecology

## 2023-07-15 DIAGNOSIS — O09522 Supervision of elderly multigravida, second trimester: Secondary | ICD-10-CM

## 2023-07-15 DIAGNOSIS — Z3183 Encounter for assisted reproductive fertility procedure cycle: Secondary | ICD-10-CM

## 2023-07-15 DIAGNOSIS — Z363 Encounter for antenatal screening for malformations: Secondary | ICD-10-CM

## 2023-07-15 DIAGNOSIS — Z3A19 19 weeks gestation of pregnancy: Secondary | ICD-10-CM

## 2023-08-07 DIAGNOSIS — O09529 Supervision of elderly multigravida, unspecified trimester: Secondary | ICD-10-CM | POA: Insufficient documentation

## 2023-08-13 ENCOUNTER — Other Ambulatory Visit: Payer: Self-pay

## 2023-08-13 ENCOUNTER — Other Ambulatory Visit: Payer: Self-pay | Admitting: *Deleted

## 2023-08-13 ENCOUNTER — Encounter: Payer: Self-pay | Admitting: *Deleted

## 2023-08-13 ENCOUNTER — Ambulatory Visit: Payer: No Typology Code available for payment source | Admitting: *Deleted

## 2023-08-13 ENCOUNTER — Ambulatory Visit: Payer: No Typology Code available for payment source | Attending: Obstetrics and Gynecology

## 2023-08-13 DIAGNOSIS — Z3183 Encounter for assisted reproductive fertility procedure cycle: Secondary | ICD-10-CM

## 2023-08-13 DIAGNOSIS — O09812 Supervision of pregnancy resulting from assisted reproductive technology, second trimester: Secondary | ICD-10-CM | POA: Diagnosis not present

## 2023-08-13 DIAGNOSIS — O09529 Supervision of elderly multigravida, unspecified trimester: Secondary | ICD-10-CM

## 2023-08-13 DIAGNOSIS — O99212 Obesity complicating pregnancy, second trimester: Secondary | ICD-10-CM | POA: Diagnosis not present

## 2023-08-13 DIAGNOSIS — O09522 Supervision of elderly multigravida, second trimester: Secondary | ICD-10-CM | POA: Diagnosis not present

## 2023-08-13 DIAGNOSIS — E063 Autoimmune thyroiditis: Secondary | ICD-10-CM | POA: Diagnosis not present

## 2023-08-13 DIAGNOSIS — Z3A19 19 weeks gestation of pregnancy: Secondary | ICD-10-CM | POA: Diagnosis present

## 2023-08-13 DIAGNOSIS — O99282 Endocrine, nutritional and metabolic diseases complicating pregnancy, second trimester: Secondary | ICD-10-CM | POA: Diagnosis not present

## 2023-08-13 DIAGNOSIS — Z363 Encounter for antenatal screening for malformations: Secondary | ICD-10-CM | POA: Insufficient documentation

## 2023-08-13 DIAGNOSIS — Z362 Encounter for other antenatal screening follow-up: Secondary | ICD-10-CM

## 2023-09-10 ENCOUNTER — Other Ambulatory Visit (HOSPITAL_BASED_OUTPATIENT_CLINIC_OR_DEPARTMENT_OTHER): Payer: Self-pay

## 2023-09-10 MED ORDER — ONDANSETRON 4 MG PO TBDP
4.0000 mg | ORAL_TABLET | Freq: Three times a day (TID) | ORAL | 0 refills | Status: DC | PRN
  Filled 2023-09-10: qty 30, 5d supply, fill #0

## 2023-09-10 MED ORDER — LOPERAMIDE HCL 2 MG PO CAPS
ORAL_CAPSULE | ORAL | 0 refills | Status: DC
  Filled 2023-09-10: qty 8, 2d supply, fill #0

## 2023-09-18 ENCOUNTER — Ambulatory Visit (HOSPITAL_BASED_OUTPATIENT_CLINIC_OR_DEPARTMENT_OTHER): Payer: No Typology Code available for payment source | Admitting: Obstetrics

## 2023-09-18 ENCOUNTER — Other Ambulatory Visit: Payer: Self-pay | Admitting: *Deleted

## 2023-09-18 ENCOUNTER — Ambulatory Visit: Payer: No Typology Code available for payment source | Attending: Obstetrics and Gynecology

## 2023-09-18 DIAGNOSIS — E669 Obesity, unspecified: Secondary | ICD-10-CM

## 2023-09-18 DIAGNOSIS — O09812 Supervision of pregnancy resulting from assisted reproductive technology, second trimester: Secondary | ICD-10-CM | POA: Diagnosis not present

## 2023-09-18 DIAGNOSIS — Z362 Encounter for other antenatal screening follow-up: Secondary | ICD-10-CM | POA: Insufficient documentation

## 2023-09-18 DIAGNOSIS — Z3A24 24 weeks gestation of pregnancy: Secondary | ICD-10-CM

## 2023-09-18 DIAGNOSIS — E039 Hypothyroidism, unspecified: Secondary | ICD-10-CM

## 2023-09-18 DIAGNOSIS — O09522 Supervision of elderly multigravida, second trimester: Secondary | ICD-10-CM | POA: Insufficient documentation

## 2023-09-18 DIAGNOSIS — O09813 Supervision of pregnancy resulting from assisted reproductive technology, third trimester: Secondary | ICD-10-CM | POA: Diagnosis not present

## 2023-09-18 DIAGNOSIS — O99282 Endocrine, nutritional and metabolic diseases complicating pregnancy, second trimester: Secondary | ICD-10-CM

## 2023-09-18 DIAGNOSIS — O99212 Obesity complicating pregnancy, second trimester: Secondary | ICD-10-CM

## 2023-09-18 NOTE — Progress Notes (Signed)
MFM Consult Note  Sherry Shepherd is currently at 24 weeks and 2 days.  She has been followed due to advanced maternal age (38 years old) and an IVF pregnancy.  She denies any problems since her last exam.  Due to the IVF pregnancy, she had a normal fetal echocardiogram performed with [facility] pediatric cardiology.  She was informed that the fetal growth and amniotic fluid level appears appropriate for her gestational age.  The overall EFW obtained today measures at the 11th percentile.  The patient reports that she has a history of lichen sclerosis.  During her last delivery in 2016, she required an episiotomy and suffered a vaginal laceration.    She reports as a result of lichen sclerosis and poor wound healing, it took her years of hormone therapy and treatment with steroids before her episiotomy site healed completely.  She also attributes the healing to the hormones that she received as part of the IVF process.  Due to her history of lichen sclerosus and poor wound healing in the vaginal area, she was advised that she may consider a primary C-section at around 39 weeks for the delivery of her current pregnancy.  Due to the low normal EFW obtained today, a follow-up growth scan was scheduled in 4 weeks.    The patient reports that both she and her husband are short and her daughter was small when she was born.    She was reassured that the baby is most likely constitutionally small.    The patient stated that all of her questions were answered today.  A total of 20 minutes was spent counseling and coordinating the care for this patient.  Greater than 50% of the time was spent in direct face-to-face contact.

## 2023-10-16 ENCOUNTER — Ambulatory Visit: Payer: No Typology Code available for payment source | Attending: Obstetrics

## 2023-10-16 DIAGNOSIS — O09523 Supervision of elderly multigravida, third trimester: Secondary | ICD-10-CM

## 2023-10-16 DIAGNOSIS — O09813 Supervision of pregnancy resulting from assisted reproductive technology, third trimester: Secondary | ICD-10-CM

## 2023-10-16 DIAGNOSIS — O09522 Supervision of elderly multigravida, second trimester: Secondary | ICD-10-CM | POA: Insufficient documentation

## 2023-10-16 DIAGNOSIS — O99283 Endocrine, nutritional and metabolic diseases complicating pregnancy, third trimester: Secondary | ICD-10-CM

## 2023-10-16 DIAGNOSIS — E039 Hypothyroidism, unspecified: Secondary | ICD-10-CM

## 2023-10-16 DIAGNOSIS — O09812 Supervision of pregnancy resulting from assisted reproductive technology, second trimester: Secondary | ICD-10-CM | POA: Insufficient documentation

## 2023-10-16 DIAGNOSIS — O99280 Endocrine, nutritional and metabolic diseases complicating pregnancy, unspecified trimester: Secondary | ICD-10-CM | POA: Diagnosis present

## 2023-10-16 DIAGNOSIS — Z3A28 28 weeks gestation of pregnancy: Secondary | ICD-10-CM

## 2023-10-19 ENCOUNTER — Other Ambulatory Visit: Payer: Self-pay

## 2023-10-19 DIAGNOSIS — O09529 Supervision of elderly multigravida, unspecified trimester: Secondary | ICD-10-CM

## 2023-10-30 ENCOUNTER — Other Ambulatory Visit (HOSPITAL_BASED_OUTPATIENT_CLINIC_OR_DEPARTMENT_OTHER): Payer: Self-pay

## 2023-10-30 MED ORDER — ONETOUCH VERIO VI STRP
ORAL_STRIP | 4 refills | Status: DC
Start: 2023-10-30 — End: 2024-01-02
  Filled 2023-10-30: qty 100, 25d supply, fill #0
  Filled 2023-11-14 – 2023-11-18 (×3): qty 100, 25d supply, fill #1
  Filled 2023-12-10: qty 100, 25d supply, fill #2

## 2023-10-30 MED ORDER — ONETOUCH VERIO FLEX SYSTEM W/DEVICE KIT
PACK | 0 refills | Status: DC
Start: 2023-10-30 — End: 2024-01-02
  Filled 2023-10-30: qty 1, 1d supply, fill #0
  Filled 2023-10-30: qty 1, 30d supply, fill #0

## 2023-10-30 MED ORDER — ONETOUCH DELICA PLUS LANCET33G MISC
4 refills | Status: DC
Start: 2023-10-30 — End: 2024-01-02
  Filled 2023-10-30: qty 100, 25d supply, fill #0
  Filled 2023-11-14 – 2023-11-18 (×3): qty 100, 25d supply, fill #1
  Filled 2023-12-10: qty 100, 25d supply, fill #2

## 2023-11-02 ENCOUNTER — Other Ambulatory Visit: Payer: Self-pay

## 2023-11-11 ENCOUNTER — Encounter: Attending: Obstetrics and Gynecology | Admitting: Dietician

## 2023-11-11 DIAGNOSIS — O99810 Abnormal glucose complicating pregnancy: Secondary | ICD-10-CM | POA: Diagnosis present

## 2023-11-11 NOTE — Progress Notes (Signed)
 Patient was seen on 11/11/2023 for Gestational Diabetes self-management class at the [facility]. The following learning objectives were met by the patient during this course:  States the definition of Gestational Diabetes States why dietary management is important in controlling blood glucose Describes the effects each nutrient has on blood glucose levels Demonstrates ability to create a balanced meal plan Demonstrates carbohydrate counting  States when to check blood glucose levels Demonstrates proper blood glucose monitoring techniques States the effect of stress and exercise on blood glucose levels States the importance of limiting caffeine and abstaining from alcohol and smoking   Patient has a meter prior to visit stating monitoring QID. Patient is instructed to continue to test pre breakfast and 2 hours after each meal. FBS: "85" , "81" mg/dL  2 hour Postprandial:  Breakfast: "91", "100" mg/dL Lunch: "89" mg/dL  Dinner: "409" mg/dL  Blood glucose today in class 75 mg/dL, reported 2 hour post prandial per Pt  Patient instructed to monitor glucose levels: QID FBS: 60 - <90 1 hour: <140 2 hour: <120  *Patient received handouts:  Nutrition Diabetes and Pregnancy Carbohydrate Counting List Blood glucose log Snack ideas for diabetes during pregnancy  Patient will be seen for follow-up as needed.

## 2023-11-12 ENCOUNTER — Other Ambulatory Visit (HOSPITAL_BASED_OUTPATIENT_CLINIC_OR_DEPARTMENT_OTHER): Payer: Self-pay

## 2023-11-16 ENCOUNTER — Other Ambulatory Visit (HOSPITAL_BASED_OUTPATIENT_CLINIC_OR_DEPARTMENT_OTHER): Payer: Self-pay

## 2023-11-16 DIAGNOSIS — O09529 Supervision of elderly multigravida, unspecified trimester: Secondary | ICD-10-CM | POA: Insufficient documentation

## 2023-11-16 DIAGNOSIS — O418X90 Other specified disorders of amniotic fluid and membranes, unspecified trimester, not applicable or unspecified: Secondary | ICD-10-CM | POA: Insufficient documentation

## 2023-11-16 DIAGNOSIS — E063 Autoimmune thyroiditis: Secondary | ICD-10-CM | POA: Insufficient documentation

## 2023-11-18 ENCOUNTER — Other Ambulatory Visit (HOSPITAL_BASED_OUTPATIENT_CLINIC_OR_DEPARTMENT_OTHER): Payer: Self-pay

## 2023-11-19 ENCOUNTER — Ambulatory Visit: Attending: Obstetrics

## 2023-11-19 ENCOUNTER — Other Ambulatory Visit: Payer: Self-pay | Admitting: Obstetrics

## 2023-11-19 ENCOUNTER — Ambulatory Visit

## 2023-11-19 ENCOUNTER — Ambulatory Visit (HOSPITAL_BASED_OUTPATIENT_CLINIC_OR_DEPARTMENT_OTHER): Admitting: Obstetrics and Gynecology

## 2023-11-19 ENCOUNTER — Other Ambulatory Visit: Payer: Self-pay | Admitting: *Deleted

## 2023-11-19 DIAGNOSIS — Z3A33 33 weeks gestation of pregnancy: Secondary | ICD-10-CM

## 2023-11-19 DIAGNOSIS — O09523 Supervision of elderly multigravida, third trimester: Secondary | ICD-10-CM

## 2023-11-19 DIAGNOSIS — O09529 Supervision of elderly multigravida, unspecified trimester: Secondary | ICD-10-CM

## 2023-11-19 DIAGNOSIS — E669 Obesity, unspecified: Secondary | ICD-10-CM

## 2023-11-19 DIAGNOSIS — O365930 Maternal care for other known or suspected poor fetal growth, third trimester, not applicable or unspecified: Secondary | ICD-10-CM | POA: Diagnosis not present

## 2023-11-19 DIAGNOSIS — O24410 Gestational diabetes mellitus in pregnancy, diet controlled: Secondary | ICD-10-CM

## 2023-11-19 DIAGNOSIS — O99213 Obesity complicating pregnancy, third trimester: Secondary | ICD-10-CM | POA: Diagnosis not present

## 2023-11-19 DIAGNOSIS — O09813 Supervision of pregnancy resulting from assisted reproductive technology, third trimester: Secondary | ICD-10-CM

## 2023-11-19 DIAGNOSIS — O365990 Maternal care for other known or suspected poor fetal growth, unspecified trimester, not applicable or unspecified: Secondary | ICD-10-CM

## 2023-11-19 NOTE — Progress Notes (Signed)
  Maternal-Fetal Medicine Consultation Name: Sherry Shepherd MRN: [id number]  G2 P1001 at 33w 1d gestation.  IVF pregnancy Advanced maternal age.  Patient reports she has gestational diabetes that is reportedly well-controlled on diet.  On today's ultrasound, estimated fetal weight is at the 3rd percentile and the abdominal circumference measurement is at the 7 percentile.  Breech presentation.  Amniotic fluid is normal good fetal activity seen.  Umbilical artery Doppler showed normal forward diastolic flow.  I counseled the patient on the following: Gestational diabetes I explained the diagnosis of gestational diabetes.  I emphasized the importance of good blood glucose control to prevent adverse fetal or neonatal outcomes.  I discussed normal range of blood glucose values. I encouraged her to check her blood glucose regularly. Possible complications of gestational diabetes include fetal macrosomia (unlikely in her case because of fetal growth restriction), stillbirth (in poorly controlled diabetes) and neonatal respiratory syndrome and other complications.  In about 85% of cases, gestational diabetes is well controlled by diet alone.  Exercise reduces the need for insulin.  Medical treatment includes oral hypoglycemics or insulin.  Type 2 diabetes develops in up to 50% of women with GDM. I recommend postpartum screening with 75-g glucose load at 6 to 12 weeks after delivery. Fetal growth restriction I explained the finding of fetal growth restriction that is difficult to differentiate from a constitutionally small for gestational age fetus. I discussed the possible causes of fetal growth restriction including placental insufficiency (most common cause).  I discussed ultrasound protocol of monitoring fetal growth restriction.  Timing of delivery: Since small for gestational age fetuses have a higher chance of having perinatal mortality and morbidity, delivery at 29 to [redacted] weeks gestation is  reasonable.  If, however, severe fetal growth restriction is seen with normal antenatal testing, we will recommend delivery at [redacted] weeks gestation.  Patient is planned to have elective cesarean delivery because of lichen sclerosus of vulva.  Recommendations - Continue weekly antenatal testing delivery. - Timing of delivery discussion after next fetal growth assessment.   Consultation including face-to-face (more than 50%) counseling 30 minutes.

## 2023-12-01 ENCOUNTER — Telehealth: Payer: Self-pay

## 2023-12-02 NOTE — Telephone Encounter (Signed)
 Follow up.

## 2023-12-09 DIAGNOSIS — O24415 Gestational diabetes mellitus in pregnancy, controlled by oral hypoglycemic drugs: Secondary | ICD-10-CM | POA: Insufficient documentation

## 2023-12-09 LAB — OB RESULTS CONSOLE GBS: GBS: POSITIVE

## 2023-12-15 ENCOUNTER — Ambulatory Visit (HOSPITAL_BASED_OUTPATIENT_CLINIC_OR_DEPARTMENT_OTHER): Admitting: Maternal & Fetal Medicine

## 2023-12-15 ENCOUNTER — Ambulatory Visit: Attending: Obstetrics and Gynecology

## 2023-12-15 VITALS — BP 119/70 | HR 107

## 2023-12-15 DIAGNOSIS — O365990 Maternal care for other known or suspected poor fetal growth, unspecified trimester, not applicable or unspecified: Secondary | ICD-10-CM | POA: Diagnosis present

## 2023-12-15 DIAGNOSIS — O24415 Gestational diabetes mellitus in pregnancy, controlled by oral hypoglycemic drugs: Secondary | ICD-10-CM | POA: Insufficient documentation

## 2023-12-15 DIAGNOSIS — O09813 Supervision of pregnancy resulting from assisted reproductive technology, third trimester: Secondary | ICD-10-CM

## 2023-12-15 DIAGNOSIS — O99213 Obesity complicating pregnancy, third trimester: Secondary | ICD-10-CM | POA: Diagnosis not present

## 2023-12-15 DIAGNOSIS — O09523 Supervision of elderly multigravida, third trimester: Secondary | ICD-10-CM | POA: Diagnosis not present

## 2023-12-15 DIAGNOSIS — Z3A36 36 weeks gestation of pregnancy: Secondary | ICD-10-CM

## 2023-12-15 DIAGNOSIS — E669 Obesity, unspecified: Secondary | ICD-10-CM

## 2023-12-15 DIAGNOSIS — O24410 Gestational diabetes mellitus in pregnancy, diet controlled: Secondary | ICD-10-CM

## 2023-12-15 NOTE — Progress Notes (Signed)
 After review, MFM consult with provider is not indicated for today  Penney Bowling, DO 12/15/2023 4:09 PM  [facility]

## 2023-12-22 NOTE — Patient Instructions (Signed)
 Sherry Shepherd  12/22/2023   Your procedure is scheduled on:  5.29.2025  Arrive at 1000 at Entrance C on [address] at [facility]  and [facility]. You are invited to use the FREE valet parking or use the Visitor's parking deck.  Pick up the phone at the desk and dial [phone].  Call this number if you have problems the morning of surgery: [phone]  Remember:   Do not eat food:(After Midnight) Desps de medianoche.  You may drink clear liquids until arrival at __0800___.  Clear liquids means a liquid you can see thru.  It can have color such as Cola or Kool aid.  Tea is OK and coffee as long as no milk or creamer of any kind.  Take these medicines the morning of surgery with A SIP OF WATER:  levothyroxine    Do not wear jewelry, make-up or nail polish.  Do not wear lotions, powders, or perfumes. Do not wear deodorant.  Do not shave 48 hours prior to surgery.  Do not bring valuables to the hospital.  [facility] is not   responsible for any belongings or valuables brought to the hospital.  Contacts, dentures or bridgework may not be worn into surgery.  Leave suitcase in the car. After surgery it may be brought to your room.  For patients admitted to the hospital, checkout time is 11:00 AM the day of              discharge.      Please read over the following fact sheets that you were given:     Preparing for Surgery

## 2023-12-23 ENCOUNTER — Encounter (HOSPITAL_COMMUNITY): Payer: Self-pay

## 2023-12-23 ENCOUNTER — Telehealth (HOSPITAL_COMMUNITY): Payer: Self-pay | Admitting: *Deleted

## 2023-12-23 NOTE — Telephone Encounter (Signed)
 Preadmission screen

## 2023-12-24 ENCOUNTER — Encounter (HOSPITAL_COMMUNITY): Payer: Self-pay

## 2023-12-25 ENCOUNTER — Other Ambulatory Visit (HOSPITAL_BASED_OUTPATIENT_CLINIC_OR_DEPARTMENT_OTHER): Payer: Self-pay

## 2023-12-25 MED ORDER — LEVOTHYROXINE SODIUM 25 MCG PO TABS
25.0000 ug | ORAL_TABLET | Freq: Every day | ORAL | 6 refills | Status: DC
  Filled 2023-12-25 – 2024-01-06 (×2): qty 30, 30d supply, fill #0
  Filled 2024-02-15: qty 30, 30d supply, fill #1
  Filled 2024-03-16: qty 30, 30d supply, fill #2
  Filled 2024-04-14: qty 30, 30d supply, fill #3
  Filled 2024-05-13: qty 30, 30d supply, fill #4
  Filled 2024-06-14: qty 30, 30d supply, fill #5
  Filled 2024-07-15: qty 30, 30d supply, fill #6

## 2023-12-29 ENCOUNTER — Encounter (HOSPITAL_COMMUNITY)
Admission: RE | Admit: 2023-12-29 | Discharge: 2023-12-29 | Disposition: A | Discharge status: Home / Self Care | Source: Ambulatory Visit | Attending: Obstetrics and Gynecology

## 2023-12-29 VITALS — Ht 60.0 in | Wt 178.0 lb

## 2023-12-29 DIAGNOSIS — Z01812 Encounter for preprocedural laboratory examination: Secondary | ICD-10-CM | POA: Insufficient documentation

## 2023-12-29 DIAGNOSIS — O09812 Supervision of pregnancy resulting from assisted reproductive technology, second trimester: Secondary | ICD-10-CM

## 2023-12-29 HISTORY — DX: Autoimmune thyroiditis: E06.3

## 2023-12-29 HISTORY — DX: Hypothyroidism, unspecified: E03.9

## 2023-12-29 LAB — CBC
HCT: 36 % (ref 36.0–46.0)
Hemoglobin: 11.8 g/dL — ABNORMAL LOW (ref 12.0–15.0)
MCH: 28.9 pg (ref 26.0–34.0)
MCHC: 32.8 g/dL (ref 30.0–36.0)
MCV: 88 fL (ref 80.0–100.0)
Platelets: 197 10*3/uL (ref 150–400)
RBC: 4.09 MIL/uL (ref 3.87–5.11)
RDW: 15.6 % — ABNORMAL HIGH (ref 11.5–15.5)
WBC: 8.8 10*3/uL (ref 4.0–10.5)
nRBC: 0 % (ref 0.0–0.2)

## 2023-12-29 LAB — TYPE AND SCREEN
ABO/RH(D): O POS
Antibody Screen: NEGATIVE

## 2023-12-30 LAB — RPR: RPR Ser Ql: NONREACTIVE

## 2023-12-31 ENCOUNTER — Encounter (HOSPITAL_COMMUNITY): Payer: Self-pay | Admitting: Obstetrics and Gynecology

## 2023-12-31 ENCOUNTER — Other Ambulatory Visit: Payer: Self-pay

## 2023-12-31 ENCOUNTER — Inpatient Hospital Stay (HOSPITAL_COMMUNITY): Payer: Self-pay | Admitting: Anesthesiology

## 2023-12-31 ENCOUNTER — Inpatient Hospital Stay (HOSPITAL_COMMUNITY)
Admission: RE | Admit: 2023-12-31 | Discharge: 2024-01-02 | DRG: 787 | Disposition: A | Discharge status: Home / Self Care | Attending: Obstetrics and Gynecology | Admitting: Obstetrics and Gynecology

## 2023-12-31 ENCOUNTER — Encounter (HOSPITAL_COMMUNITY)
Admission: RE | Disposition: A | Discharge status: Home / Self Care | Payer: Self-pay | Source: Home / Self Care | Attending: Obstetrics and Gynecology

## 2023-12-31 DIAGNOSIS — O321XX0 Maternal care for breech presentation, not applicable or unspecified: Secondary | ICD-10-CM

## 2023-12-31 DIAGNOSIS — J45909 Unspecified asthma, uncomplicated: Secondary | ICD-10-CM | POA: Diagnosis present

## 2023-12-31 DIAGNOSIS — K219 Gastro-esophageal reflux disease without esophagitis: Secondary | ICD-10-CM | POA: Diagnosis present

## 2023-12-31 DIAGNOSIS — O9081 Anemia of the puerperium: Secondary | ICD-10-CM | POA: Diagnosis not present

## 2023-12-31 DIAGNOSIS — O328XX0 Maternal care for other malpresentation of fetus, not applicable or unspecified: Secondary | ICD-10-CM | POA: Diagnosis present

## 2023-12-31 DIAGNOSIS — O9962 Diseases of the digestive system complicating childbirth: Secondary | ICD-10-CM | POA: Diagnosis present

## 2023-12-31 DIAGNOSIS — O26893 Other specified pregnancy related conditions, third trimester: Secondary | ICD-10-CM | POA: Diagnosis present

## 2023-12-31 DIAGNOSIS — L900 Lichen sclerosus et atrophicus: Secondary | ICD-10-CM | POA: Diagnosis present

## 2023-12-31 DIAGNOSIS — Z3A39 39 weeks gestation of pregnancy: Secondary | ICD-10-CM | POA: Diagnosis not present

## 2023-12-31 DIAGNOSIS — Z833 Family history of diabetes mellitus: Secondary | ICD-10-CM

## 2023-12-31 DIAGNOSIS — Z3A35 35 weeks gestation of pregnancy: Secondary | ICD-10-CM

## 2023-12-31 DIAGNOSIS — E063 Autoimmune thyroiditis: Secondary | ICD-10-CM | POA: Diagnosis present

## 2023-12-31 DIAGNOSIS — O99284 Endocrine, nutritional and metabolic diseases complicating childbirth: Secondary | ICD-10-CM | POA: Diagnosis present

## 2023-12-31 DIAGNOSIS — D62 Acute posthemorrhagic anemia: Secondary | ICD-10-CM | POA: Diagnosis not present

## 2023-12-31 DIAGNOSIS — O24420 Gestational diabetes mellitus in childbirth, diet controlled: Secondary | ICD-10-CM | POA: Diagnosis present

## 2023-12-31 DIAGNOSIS — Z8249 Family history of ischemic heart disease and other diseases of the circulatory system: Secondary | ICD-10-CM | POA: Diagnosis not present

## 2023-12-31 DIAGNOSIS — O9952 Diseases of the respiratory system complicating childbirth: Secondary | ICD-10-CM | POA: Diagnosis present

## 2023-12-31 DIAGNOSIS — O99824 Streptococcus B carrier state complicating childbirth: Secondary | ICD-10-CM | POA: Diagnosis present

## 2023-12-31 DIAGNOSIS — Z9889 Other specified postprocedural states: Principal | ICD-10-CM

## 2023-12-31 DIAGNOSIS — K449 Diaphragmatic hernia without obstruction or gangrene: Secondary | ICD-10-CM | POA: Diagnosis present

## 2023-12-31 LAB — GLUCOSE, CAPILLARY
Glucose-Capillary: 83 mg/dL (ref 70–99)
Glucose-Capillary: 92 mg/dL (ref 70–99)

## 2023-12-31 SURGERY — Surgical Case
Anesthesia: Spinal

## 2023-12-31 MED ORDER — PRENATAL MULTIVITAMIN CH
1.0000 | ORAL_TABLET | Freq: Every day | ORAL | Status: DC
  Administered 2024-01-01 – 2024-01-02 (×2): 1 via ORAL
  Filled 2023-12-31 (×2): qty 1

## 2023-12-31 MED ORDER — GABAPENTIN 100 MG PO CAPS
100.0000 mg | ORAL_CAPSULE | Freq: Two times a day (BID) | ORAL | Status: DC
  Administered 2023-12-31 – 2024-01-02 (×4): 100 mg via ORAL
  Filled 2023-12-31 (×4): qty 1

## 2023-12-31 MED ORDER — SENNOSIDES-DOCUSATE SODIUM 8.6-50 MG PO TABS
2.0000 | ORAL_TABLET | Freq: Every day | ORAL | Status: DC
  Administered 2024-01-01: 2 via ORAL
  Filled 2023-12-31 (×2): qty 2

## 2023-12-31 MED ORDER — OXYTOCIN-SODIUM CHLORIDE 30-0.9 UT/500ML-% IV SOLN: 2.5000 [IU]/h | INTRAVENOUS | Status: AC

## 2023-12-31 MED ORDER — LACTATED RINGERS IV SOLN: INTRAVENOUS | Status: DC

## 2023-12-31 MED ORDER — DIPHENHYDRAMINE HCL 25 MG PO CAPS: 25.0000 mg | ORAL_CAPSULE | Freq: Four times a day (QID) | ORAL | Status: DC | PRN

## 2023-12-31 MED ORDER — ACETAMINOPHEN 500 MG PO TABS
1000.0000 mg | ORAL_TABLET | Freq: Four times a day (QID) | ORAL | Status: DC
  Administered 2023-12-31 – 2024-01-02 (×7): 1000 mg via ORAL
  Filled 2023-12-31 (×8): qty 2

## 2023-12-31 MED ORDER — KETOROLAC TROMETHAMINE 30 MG/ML IJ SOLN
30.0000 mg | Freq: Four times a day (QID) | INTRAMUSCULAR | Status: AC
  Administered 2023-12-31 – 2024-01-01 (×4): 30 mg via INTRAVENOUS
  Filled 2023-12-31 (×4): qty 1

## 2023-12-31 MED ORDER — FENTANYL CITRATE (PF) 100 MCG/2ML IJ SOLN
INTRAMUSCULAR | Status: DC | PRN
  Administered 2023-12-31: 50 ug via INTRAVENOUS
  Administered 2023-12-31: 35 ug via INTRAVENOUS

## 2023-12-31 MED ORDER — SODIUM CHLORIDE 0.9% FLUSH
3.0000 mL | INTRAVENOUS | Status: DC | PRN
Start: 2023-12-31 — End: 2024-01-03

## 2023-12-31 MED ORDER — FENTANYL CITRATE (PF) 100 MCG/2ML IJ SOLN
INTRAMUSCULAR | Status: DC | PRN
  Administered 2023-12-31: 15 ug via INTRATHECAL

## 2023-12-31 MED ORDER — CLINDAMYCIN PHOSPHATE 900 MG/50ML IV SOLN
INTRAVENOUS | Status: AC
  Filled 2023-12-31: qty 50

## 2023-12-31 MED ORDER — PHENYLEPHRINE 80 MCG/ML (10ML) SYRINGE FOR IV PUSH (FOR BLOOD PRESSURE SUPPORT)
PREFILLED_SYRINGE | INTRAVENOUS | Status: AC
  Filled 2023-12-31: qty 10

## 2023-12-31 MED ORDER — DEXAMETHASONE SODIUM PHOSPHATE 10 MG/ML IJ SOLN
INTRAMUSCULAR | Status: AC
  Filled 2023-12-31: qty 1

## 2023-12-31 MED ORDER — CLINDAMYCIN PHOSPHATE 900 MG/50ML IV SOLN
900.0000 mg | INTRAVENOUS | Status: AC
  Administered 2023-12-31: 900 mg via INTRAVENOUS

## 2023-12-31 MED ORDER — SIMETHICONE 80 MG PO CHEW
80.0000 mg | CHEWABLE_TABLET | ORAL | Status: DC | PRN
  Administered 2024-01-01: 80 mg via ORAL
  Filled 2023-12-31: qty 1

## 2023-12-31 MED ORDER — OXYTOCIN-SODIUM CHLORIDE 30-0.9 UT/500ML-% IV SOLN
INTRAVENOUS | Status: AC
  Filled 2023-12-31: qty 500

## 2023-12-31 MED ORDER — ACETAMINOPHEN 500 MG PO TABS: 1000.0000 mg | ORAL_TABLET | Freq: Four times a day (QID) | ORAL | Status: DC

## 2023-12-31 MED ORDER — WITCH HAZEL-GLYCERIN EX PADS: 1.0000 | MEDICATED_PAD | CUTANEOUS | Status: DC | PRN

## 2023-12-31 MED ORDER — ACETAMINOPHEN 10 MG/ML IV SOLN
INTRAVENOUS | Status: DC | PRN
  Administered 2023-12-31: 1000 mg via INTRAVENOUS

## 2023-12-31 MED ORDER — OXYCODONE HCL 5 MG PO TABS
5.0000 mg | ORAL_TABLET | ORAL | Status: DC | PRN
  Administered 2024-01-01: 10 mg via ORAL
  Administered 2024-01-01: 5 mg via ORAL
  Administered 2024-01-01 – 2024-01-02 (×2): 10 mg via ORAL
  Administered 2024-01-02: 5 mg via ORAL
  Administered 2024-01-02: 10 mg via ORAL
  Filled 2023-12-31 (×2): qty 2
  Filled 2023-12-31 (×2): qty 1
  Filled 2023-12-31 (×2): qty 2

## 2023-12-31 MED ORDER — OXYCODONE HCL 5 MG/5ML PO SOLN: 5.0000 mg | Freq: Once | ORAL | Status: DC | PRN

## 2023-12-31 MED ORDER — DIPHENHYDRAMINE HCL 50 MG/ML IJ SOLN
12.5000 mg | INTRAMUSCULAR | Status: DC | PRN
Start: 2023-12-31 — End: 2023-12-31

## 2023-12-31 MED ORDER — PHENYLEPHRINE HCL-NACL 20-0.9 MG/250ML-% IV SOLN
INTRAVENOUS | Status: DC | PRN
  Administered 2023-12-31: 60 ug/min via INTRAVENOUS

## 2023-12-31 MED ORDER — SCOPOLAMINE 1 MG/3DAYS TD PT72: 1.0000 | MEDICATED_PATCH | Freq: Once | TRANSDERMAL | Status: DC

## 2023-12-31 MED ORDER — OXYTOCIN-SODIUM CHLORIDE 30-0.9 UT/500ML-% IV SOLN
INTRAVENOUS | Status: DC | PRN
  Administered 2023-12-31: 300 mL via INTRAVENOUS

## 2023-12-31 MED ORDER — DEXAMETHASONE SODIUM PHOSPHATE 10 MG/ML IJ SOLN
INTRAMUSCULAR | Status: DC | PRN
  Administered 2023-12-31: 10 mg via INTRAVENOUS

## 2023-12-31 MED ORDER — BUPIVACAINE IN DEXTROSE 0.75-8.25 % IT SOLN
INTRATHECAL | Status: DC | PRN
  Administered 2023-12-31: 1.6 mL via INTRATHECAL

## 2023-12-31 MED ORDER — DIPHENHYDRAMINE HCL 25 MG PO CAPS: 25.0000 mg | ORAL_CAPSULE | ORAL | Status: DC | PRN

## 2023-12-31 MED ORDER — MORPHINE SULFATE (PF) 0.5 MG/ML IJ SOLN
INTRAMUSCULAR | Status: AC
  Filled 2023-12-31: qty 10

## 2023-12-31 MED ORDER — MORPHINE SULFATE (PF) 0.5 MG/ML IJ SOLN
INTRAMUSCULAR | Status: DC | PRN
  Administered 2023-12-31: 150 ug via INTRATHECAL

## 2023-12-31 MED ORDER — OXYCODONE HCL 5 MG PO TABS: 5.0000 mg | ORAL_TABLET | Freq: Once | ORAL | Status: DC | PRN

## 2023-12-31 MED ORDER — SODIUM CHLORIDE 0.9 % IR SOLN
Status: DC | PRN
  Administered 2023-12-31: 1000 mL

## 2023-12-31 MED ORDER — MEPERIDINE HCL 25 MG/ML IJ SOLN: 6.2500 mg | INTRAMUSCULAR | Status: DC | PRN

## 2023-12-31 MED ORDER — SIMETHICONE 80 MG PO CHEW
80.0000 mg | CHEWABLE_TABLET | Freq: Three times a day (TID) | ORAL | Status: DC
  Administered 2023-12-31 – 2024-01-02 (×5): 80 mg via ORAL
  Filled 2023-12-31 (×5): qty 1

## 2023-12-31 MED ORDER — ONDANSETRON HCL 4 MG/2ML IJ SOLN
INTRAMUSCULAR | Status: AC
  Filled 2023-12-31: qty 2

## 2023-12-31 MED ORDER — PHENYLEPHRINE HCL-NACL 20-0.9 MG/250ML-% IV SOLN
INTRAVENOUS | Status: AC
  Filled 2023-12-31: qty 250

## 2023-12-31 MED ORDER — FENTANYL CITRATE (PF) 100 MCG/2ML IJ SOLN
INTRAMUSCULAR | Status: AC
  Filled 2023-12-31: qty 2

## 2023-12-31 MED ORDER — SOD CITRATE-CITRIC ACID 500-334 MG/5ML PO SOLN: 30.0000 mL | ORAL | Status: DC

## 2023-12-31 MED ORDER — COCONUT OIL OIL
1.0000 | TOPICAL_OIL | Status: DC | PRN
Start: 2023-12-31 — End: 2024-01-03

## 2023-12-31 MED ORDER — STERILE WATER FOR IRRIGATION IR SOLN
Status: DC | PRN
  Administered 2023-12-31: 1000 mL

## 2023-12-31 MED ORDER — DROPERIDOL 2.5 MG/ML IJ SOLN: 0.6250 mg | Freq: Once | INTRAMUSCULAR | Status: DC | PRN

## 2023-12-31 MED ORDER — ACETAMINOPHEN 10 MG/ML IV SOLN
INTRAVENOUS | Status: AC
  Filled 2023-12-31: qty 100

## 2023-12-31 MED ORDER — FENTANYL CITRATE (PF) 100 MCG/2ML IJ SOLN: 25.0000 ug | INTRAMUSCULAR | Status: DC | PRN

## 2023-12-31 MED ORDER — IBUPROFEN 600 MG PO TABS
600.0000 mg | ORAL_TABLET | Freq: Four times a day (QID) | ORAL | Status: DC
  Administered 2024-01-01 – 2024-01-02 (×4): 600 mg via ORAL
  Filled 2023-12-31 (×4): qty 1

## 2023-12-31 MED ORDER — NALOXONE HCL 4 MG/10ML IJ SOLN: 1.0000 ug/kg/h | INTRAVENOUS | Status: DC | PRN

## 2023-12-31 MED ORDER — ACETAMINOPHEN 10 MG/ML IV SOLN: 1000.0000 mg | Freq: Once | INTRAVENOUS | Status: DC | PRN

## 2023-12-31 MED ORDER — GENTAMICIN SULFATE 40 MG/ML IJ SOLN
5.0000 mg/kg | INTRAVENOUS | Status: AC
  Administered 2023-12-31: 310 mg via INTRAVENOUS
  Filled 2023-12-31: qty 7.75

## 2023-12-31 MED ORDER — DIBUCAINE (PERIANAL) 1 % EX OINT: 1.0000 | TOPICAL_OINTMENT | CUTANEOUS | Status: DC | PRN

## 2023-12-31 MED ORDER — ONDANSETRON HCL 4 MG/2ML IJ SOLN: 4.0000 mg | Freq: Three times a day (TID) | INTRAMUSCULAR | Status: DC | PRN

## 2023-12-31 MED ORDER — ONDANSETRON HCL 4 MG/2ML IJ SOLN
INTRAMUSCULAR | Status: DC | PRN
  Administered 2023-12-31: 4 mg via INTRAVENOUS

## 2023-12-31 MED ORDER — NALOXONE HCL 0.4 MG/ML IJ SOLN: 0.4000 mg | INTRAMUSCULAR | Status: DC | PRN

## 2023-12-31 MED ORDER — LEVOTHYROXINE SODIUM 50 MCG PO TABS
25.0000 ug | ORAL_TABLET | Freq: Every day | ORAL | Status: DC
  Administered 2024-01-01 – 2024-01-02 (×2): 25 ug via ORAL
  Filled 2023-12-31 (×2): qty 1

## 2023-12-31 MED ORDER — MENTHOL 3 MG MT LOZG: 1.0000 | LOZENGE | OROMUCOSAL | Status: DC | PRN

## 2023-12-31 SURGICAL SUPPLY — 35 items
BENZOIN TINCTURE PRP APPL 2/3 (GAUZE/BANDAGES/DRESSINGS) IMPLANT
CANISTER PREVENA PLUS 150 (CANNISTER) IMPLANT
CHLORAPREP W/TINT 26 (MISCELLANEOUS) ×2 IMPLANT
CLAMP UMBILICAL CORD (MISCELLANEOUS) ×1 IMPLANT
CLIP FILSHIE TUBAL LIGA STRL (Clip) IMPLANT
CLOTH BEACON ORANGE TIMEOUT ST (SAFETY) ×1 IMPLANT
DERMABOND ADVANCED .7 DNX12 (GAUZE/BANDAGES/DRESSINGS) IMPLANT
DRESSING PREVENA PLUS CUSTOM (GAUZE/BANDAGES/DRESSINGS) IMPLANT
DRSG OPSITE POSTOP 4X10 (GAUZE/BANDAGES/DRESSINGS) ×1 IMPLANT
ELECTRODE REM PT RTRN 9FT ADLT (ELECTROSURGICAL) ×1 IMPLANT
EXTRACTOR VACUUM KIWI (MISCELLANEOUS) IMPLANT
GLOVE BIO SURGEON STRL SZ 6.5 (GLOVE) ×1 IMPLANT
GLOVE BIOGEL PI IND STRL 7.0 (GLOVE) ×1 IMPLANT
GOWN STRL REUS W/TWL LRG LVL3 (GOWN DISPOSABLE) ×2 IMPLANT
KIT ABG SYR 3ML LUER SLIP (SYRINGE) IMPLANT
NDL HYPO 25X5/8 SAFETYGLIDE (NEEDLE) IMPLANT
NEEDLE HYPO 22GX1.5 SAFETY (NEEDLE) IMPLANT
NEEDLE HYPO 25X5/8 SAFETYGLIDE (NEEDLE) IMPLANT
NS IRRIG 1000ML POUR BTL (IV SOLUTION) ×1 IMPLANT
PACK C SECTION WH (CUSTOM PROCEDURE TRAY) ×1 IMPLANT
PAD OB MATERNITY 4.3X12.25 (PERSONAL CARE ITEMS) ×1 IMPLANT
RETAINER VISCERAL (MISCELLANEOUS) IMPLANT
RTRCTR C-SECT PINK 25CM LRG (MISCELLANEOUS) ×1 IMPLANT
STRIP CLOSURE SKIN 1/2X4 (GAUZE/BANDAGES/DRESSINGS) IMPLANT
SUT MNCRL 0 VIOLET CTX 36 (SUTURE) ×1 IMPLANT
SUT PDS AB 0 CTX 60 (SUTURE) ×1 IMPLANT
SUT VIC AB 0 CTX36XBRD ANBCTRL (SUTURE) ×1 IMPLANT
SUT VIC AB 2-0 CT1 (SUTURE) IMPLANT
SUT VIC AB 4-0 KS 27 (SUTURE) IMPLANT
SUT VICRYL+ 3-0 36IN CT-1 (SUTURE) ×1 IMPLANT
SUTURE PLAIN GUT 2.0 ETHICON (SUTURE) IMPLANT
SYR 30ML LL (SYRINGE) IMPLANT
TOWEL OR 17X24 6PK STRL BLUE (TOWEL DISPOSABLE) ×1 IMPLANT
TRAY FOLEY W/BAG SLVR 14FR LF (SET/KITS/TRAYS/PACK) ×1 IMPLANT
WATER STERILE IRR 1000ML POUR (IV SOLUTION) ×1 IMPLANT

## 2023-12-31 NOTE — Op Note (Signed)
 CESAREAN SECTION Procedure Note  Patient: Sherry Shepherd  Preoperative Diagnosis: history of poorly healing 3rd degree laceration with prior vaginal delivery, breech presentation Postoperative Diagnosis: same, delivered  Procedure: primary low transverse C-section     Surgeon: Alroy Jericho, MD  Assistant surgeon: Reggy Capers, MD  A skilled assistant was required for this case due to its complexity.  Anesthesia: Spinal anesthesia   Findings: Normal appearing uterus, fallopian tubes bilaterally, and ovaries bilaterally.  Viable female infant in footling breech presentation delivered at 13:00 with weight 6lb 10.5oz Apgars 8 and 8.  Estimated Blood Loss:  706cc not including post-op fundal rub         Specimens: Placenta to L&D         Complications:  None         Disposition: PACU - hemodynamically stable.         Condition: stable    Description of Procedure: The patient was taken to the operating room where spinal anesthesia was placed and found to be adequate. The patient was placed in the dorsal supine position.  Fetal heart tones were confirmed. SCD were applied and cycling. A foley catheter was inserted and draining. Gentamicin and clindamycin  were given for infection prophylaxis. The patient was subsequently prepped and draped in the normal sterile fashion.  A routine pre-operative time out was performed.  A low transverse skin incision was made with a scalpel and carried down to the level of the fascia with the Bovie.  The fascia was incised in the midline with the scalpel and extended laterally with curved Mayo scissors.  Kocher clamps were applied to the superior fascial edge and the fascia was dissected off the rectus muscle bluntly and sharply using the Mayo scissors. The rectus muscles then were separated in the midline.  The peritoneum was found free of adherent bowel and the peritoneal cavity was entered bluntly.  The uterus was identified and the alexis retractor was placed  intraperitoneal.  A low transverse hysterotomy was then made with a scalpel.  The infant was found in the footling breech presentation was delivered atraumatically and without difficulty with standard breech maneuvers. No nuchal. The infant was stimulated and nares was suctioned using suction bulb on the field per routine. After 60 seconds of delayed cord clamping the cord was clamped and cut and the infant was handed off to the pediatric team. The placenta was delivered manually. The uterus was cleared of all clot and debris.  The hysterotomy was then closed with 0 monocryl in a running locked fashion.  The hysterotomy was found to be hemostatic. The alexis retractor was removed. Local anesthetic was place intraperitoneal, as patient was having discomfort during the surgery. A fish retractor was placed intraperitoneal and the peritoneum was closed in a vertical running fashion with 2-0 Vicryl, with removal of the fish retractor before the final stitch. The fascia was closed with a 0 Vicryl suture in a continuous running fashion. The subcutaneous tissue was irrigated and rendered hemostatic with cautery.  The subcutaneous layer was subsequently closed with 2-0 plain gut in an interrupted fashion.  The skin was closed with 4-0 vicryl  in a running subcuticular fashion.  Sponge, lap and needle counts were correct. A Prevena wound vac was placed on the incision.  The patient was doing well and baby was in his father's arms at the bedside when I left the OR.  Alroy Jericho, MD

## 2023-12-31 NOTE — Transfer of Care (Signed)
 Immediate Anesthesia Transfer of Care Note  Patient: Sherry Shepherd  Procedure(s) Performed: CESAREAN DELIVERY  Patient Location: PACU  Anesthesia Type:Spinal  Level of Consciousness: awake, alert , and oriented  Airway & Oxygen Therapy: Patient Spontanous Breathing  Post-op Assessment: Report given to RN and Post -op Vital signs reviewed and stable  Post vital signs: Reviewed and stable  Last Vitals:  Vitals Value Taken Time  BP 109/68 12/31/23 1413  Temp    Pulse 83 12/31/23 1417  Resp 24 12/31/23 1417  SpO2 99 % 12/31/23 1417  Vitals shown include unfiled device data.  Last Pain: There were no vitals filed for this visit.       Complications: No notable events documented.

## 2023-12-31 NOTE — H&P (Addendum)
 Sherry Shepherd is a 38 y.o. G75P1001  female at [redacted]w[redacted]d presenting for scheduled primary C-section. She has lichen sclerosis and had a very difficult time healing after her prior vaginal delivery with a 3rd degree laceration and therefore has requested a C-section delivery for this baby. She has no new complaints aside from mild contractions today. She denies LOF, VB, dFM.  Her pregnancy has otherwise been complicated by: -FGR: resolved on [redacted]w[redacted]d US , ok to deliver at 39w -gDMA1 -IVF pregnancy: normal MFM anatomy US , normal fetal ECHO -Hashimoto thyroiditis. On Synthroid  25mcg daily -FOB with history of pulm sequestration at birth -risk of pre-E: AMA, IVF, BMI. Was on ASA -subchorionic hematoma on viability scan -asthma: no meds, avoid hemabate  OB History     Gravida  2   Para  1   Term  1   Preterm      AB      Living  1      SAB      IAB      Ectopic      Multiple  0   Live Births  1          Past Medical History:  Diagnosis Date   Allergy    GERD (gastroesophageal reflux disease)    Gestational diabetes    Hashimoto's disease    History of hiatal hernia    Hx of varicella    Hypothyroidism    Lichen sclerosus of female genitalia    Migraine    Overweight 10/07/2011   Past Surgical History:  Procedure Laterality Date   DENTAL SURGERY     Family History: family history includes Birth defects in her brother; Congestive Heart Failure in her paternal grandfather; Diabetes in her father and mother; Hyperlipidemia in her father; Macular degeneration in her mother. Social History:  reports that she has never smoked. She has never used smokeless tobacco. She reports that she does not drink alcohol and does not use drugs.     Maternal Diabetes: Yes:  Diabetes Type:  Diet controlled Genetic Screening: Normal Maternal Ultrasounds/Referrals: Normal Fetal Ultrasounds or other Referrals:  Fetal echo, Referred to Materal Fetal Medicine  Maternal Substance Abuse:   No Significant Maternal Medications:  Meds include: Syntroid Significant Maternal Lab Results:  Group B Strep positive Number of Prenatal Visits:greater than 3 verified prenatal visits Maternal Vaccinations:TDap   Review of Systems  All other systems reviewed and are negative.  Maternal Medical History:  Contractions: Onset was 13-24 hours ago.   Frequency: rare.   Perceived severity is mild.   Fetal activity: Perceived fetal activity is normal.   Prenatal complications: No bleeding, cholelithiasis, HIV, PIH, infection, IUGR, nephrolithiasis, oligohydramnios, placental abnormality, polyhydramnios, pre-eclampsia, preterm labor, substance abuse, thrombocytopenia or thrombophilia.   Prenatal Complications - Diabetes: gestational. Diabetes is managed by diet.       Pulse (!) 104, temperature 98.3 F (36.8 C), height 5\' 1"  (1.549 m), weight 82.1 kg, last menstrual period 03/30/2023, SpO2 97%. Maternal Exam:  Abdomen: Patient reports no abdominal tenderness. Introitus: not evaluated.   Cervix: not evaluated.   Fetal Exam Fetal Monitor Review: Mode: hand-held doppler probe.   Baseline rate: 140.    Physical Exam Vitals reviewed.  Constitutional:      Appearance: Normal appearance.  HENT:     Head: Normocephalic.     Nose: Nose normal.  Eyes:     Extraocular Movements: Extraocular movements intact.  Cardiovascular:     Comments: Well perfused Pulmonary:  Effort: Pulmonary effort is normal.  Abdominal:     Comments: Gravid, non-tender  Musculoskeletal:        General: Normal range of motion.     Cervical back: Normal range of motion.  Skin:    General: Skin is warm.  Neurological:     General: No focal deficit present.     Mental Status: She is alert and oriented to person, place, and time.  Psychiatric:        Mood and Affect: Mood normal.        Behavior: Behavior normal.        Thought Content: Thought content normal.        Judgment: Judgment normal.      Prenatal labs: ABO, Rh: --/--/O POS (05/27 1256) Antibody: NEG (05/27 1256) Rubella: Immune (11/04 0000) RPR: NON REACTIVE (05/27 1251)  HBsAg: Negative (11/04 0000)  HIV: Non-reactive (11/04 0000)  GBS: Positive/-- (05/07 0000)   Assessment/Plan: Sherry Shepherd is a 38 y.o. G2P1001  female at [redacted]w[redacted]d presenting for scheduled primary C-section for history of poor healing in setting of prior 3rd degree and lichen sclerosis. Additionally, baby was found to be in breech presentation on today's bedside ultrasound. -pCS: Previously discussed R/B/A to C-section at clinic, as described below. Hgb 11.8, platelets 197. Plan   (see addendum) for infection ppx and SCDs for DVT ppx.  -h/o of infections/poor healing: plan prevena. -FGR: resolved on [redacted]w[redacted]d US , ok to deliver at 39w -gDMA1: CBG 83. Plan 6w postpartum 2hr GTT. -IVF pregnancy: normal MFM anatomy US , normal fetal ECHO -Hashimoto thyroiditis. On Synthroid  25mcg daily, continue postpartum -FOB with history of pulm sequestration at birth: Normal fetal anatomy per MFM -risk of pre-E: AMA, IVF, BMI. Was on ASA -asthma: no meds, avoid hemabate  Dispo: To OR now.   Risks and benefits of cesarean were reviewed with patient and family, including anesthesia, bleeding, infection, and damage to other organs.  Patient and family seem to understand these risks and are in agreement with proceeding with cesarean.   Gerrad Welker A Sanaiyah Kirchhoff 12/31/2023, 12:02 PM  Addendum for correction: gentamicin and clindamycin  for antibiotics due to hives with amoxicillin and cefprozil

## 2023-12-31 NOTE — Anesthesia Preprocedure Evaluation (Addendum)
 Anesthesia Evaluation  Patient identified by MRN, date of birth, ID band Patient awake    Reviewed: Allergy & Precautions, H&P , NPO status , Patient's Chart, lab work & pertinent test results  Airway Mallampati: II  TM Distance: >3 FB Neck ROM: Full    Dental no notable dental hx.  Front upper bridge:   Pulmonary neg pulmonary ROS   Pulmonary exam normal breath sounds clear to auscultation       Cardiovascular negative cardio ROS Normal cardiovascular exam Rhythm:Regular Rate:Normal     Neuro/Psych  Headaches  negative psych ROS   GI/Hepatic Neg liver ROS, hiatal hernia,GERD  ,,  Endo/Other  diabetes, GestationalHypothyroidism    Renal/GU negative Renal ROS  negative genitourinary   Musculoskeletal negative musculoskeletal ROS (+)    Abdominal   Peds negative pediatric ROS (+)  Hematology  (+) Blood dyscrasia, anemia   Anesthesia Other Findings Plt 197  Reproductive/Obstetrics (+) Pregnancy                             Anesthesia Physical Anesthesia Plan  ASA: 2  Anesthesia Plan: Spinal   Post-op Pain Management:    Induction:   PONV Risk Score and Plan: 2 and Treatment may vary due to age or medical condition  Airway Management Planned: Natural Airway  Additional Equipment:   Intra-op Plan:   Post-operative Plan:   Informed Consent: I have reviewed the patients History and Physical, chart, labs and discussed the procedure including the risks, benefits and alternatives for the proposed anesthesia with the patient or authorized representative who has indicated his/her understanding and acceptance.     Dental advisory given  Plan Discussed with: CRNA  Anesthesia Plan Comments:         Anesthesia Quick Evaluation

## 2023-12-31 NOTE — Anesthesia Postprocedure Evaluation (Signed)
 Anesthesia Post Note  Patient: Sherry Shepherd  Procedure(s) Performed: CESAREAN DELIVERY     Patient location during evaluation: PACU Anesthesia Type: Spinal Level of consciousness: oriented and awake and alert Pain management: pain level controlled Vital Signs Assessment: post-procedure vital signs reviewed and stable Respiratory status: spontaneous breathing, respiratory function stable and patient connected to nasal cannula oxygen Cardiovascular status: blood pressure returned to baseline and stable Postop Assessment: no headache, no backache and no apparent nausea or vomiting Anesthetic complications: no   No notable events documented.  Last Vitals:  Vitals:   12/31/23 1415 12/31/23 1420  BP: 109/68   Pulse: 85 76  Resp: 19 19  Temp:    SpO2: 98% 97%    Last Pain: There were no vitals filed for this visit. Pain Goal:                   [name]

## 2023-12-31 NOTE — Lactation Note (Signed)
 This note was copied from a baby's chart. Lactation Consultation Note  Patient Name: Sherry Shepherd [id number] Date: 12/31/2023 Age:39 hours Reason for consult: Initial assessment;Exclusive pumping and bottle feeding Per MOB, she does not want to latch infant to breast, her feeding choice is "pumping only and formula feeding "infant. MOB was using her Spectra  DEBP  when LC was in the room and plans to continue to pump every 3 hours. For 15 minutes Infant has been receiving formula  and recently consumed 16 mls. MOB knows to offer infant any EBM first that is pumped and then formula. MOB knows that her EBM is safe for 4 hours at room temperature whereas formula once open is safe for 1 hour. MOB has Feeding Guidelines and knows on Day 1 to offer 5-15 mls of any EBM/Formula per feeding or more if infant wants it. MOB knows to feed infant 8+ times within 24 hours. LC discussed importance of maternal rest, meals and hydration. MOB was  made aware of O/P services, breastfeeding support groups, community resources, and our phone # for post-discharge questions.    Maternal Data Has patient been taught Hand Expression?: (P) Yes Does the patient have breastfeeding experience prior to this delivery?: (P) Yes How long did the patient breastfeed?: (P) MOB decided to pump only and formula feed infant due latch difficulties with 1st child and with her having high weight loss, she exclusively pumped for 10 months.  Feeding Mother's Current Feeding Choice: (P) Breast Milk and Formula Nipple Type: Slow - flow  LATCH Score   MOB's feeding choice is pumping only and formula feeding infant.                  Lactation Tools Discussed/Used Tools: (P) Flanges;Pump Flange Size: (P) 24 Breast pump type: (P) Double-Electric Breast Pump (MOB is using her Spectra  DEBP) Pump Education: (P) Setup, frequency, and cleaning;Milk Storage Reason for Pumping: (P) MOB choice not latching infant at the breast. Pumping  frequency: (P) Continue to pump every 3 hours for 15 minutes  Interventions Interventions: (P) Breast feeding basics reviewed;Hand pump;DEBP;Education  Discharge    Consult Status      Sherry Shepherd 12/31/2023, 8:47 PM

## 2024-01-01 LAB — CBC
HCT: 24.1 % — ABNORMAL LOW (ref 36.0–46.0)
HCT: 23.4 % — ABNORMAL LOW (ref 36.0–46.0)
Hemoglobin: 7.9 g/dL — ABNORMAL LOW (ref 12.0–15.0)
Hemoglobin: 7.8 g/dL — ABNORMAL LOW (ref 12.0–15.0)
MCH: 29.3 pg (ref 26.0–34.0)
MCH: 29.9 pg (ref 26.0–34.0)
MCHC: 32.8 g/dL (ref 30.0–36.0)
MCHC: 33.3 g/dL (ref 30.0–36.0)
MCV: 89.3 fL (ref 80.0–100.0)
MCV: 89.7 fL (ref 80.0–100.0)
Platelets: 185 10*3/uL (ref 150–400)
Platelets: 174 10*3/uL (ref 150–400)
RBC: 2.7 MIL/uL — ABNORMAL LOW (ref 3.87–5.11)
RBC: 2.61 MIL/uL — ABNORMAL LOW (ref 3.87–5.11)
RDW: 15.5 % (ref 11.5–15.5)
RDW: 15.7 % — ABNORMAL HIGH (ref 11.5–15.5)
WBC: 10.1 10*3/uL (ref 4.0–10.5)
WBC: 8.5 10*3/uL (ref 4.0–10.5)
nRBC: 0 % (ref 0.0–0.2)
nRBC: 0 % (ref 0.0–0.2)

## 2024-01-01 LAB — COMPREHENSIVE METABOLIC PANEL WITH GFR
ALT: 15 U/L (ref 0–44)
AST: 22 U/L (ref 15–41)
Albumin: 2.2 g/dL — ABNORMAL LOW (ref 3.5–5.0)
Alkaline Phosphatase: 48 U/L (ref 38–126)
Anion gap: 10 (ref 5–15)
BUN: 13 mg/dL (ref 6–20)
CO2: 21 mmol/L — ABNORMAL LOW (ref 22–32)
Calcium: 8.7 mg/dL — ABNORMAL LOW (ref 8.9–10.3)
Chloride: 107 mmol/L (ref 98–111)
Creatinine, Ser: 0.73 mg/dL (ref 0.44–1.00)
GFR, Estimated: >60 mL/min (ref >60)
Glucose, Bld: 92 mg/dL (ref 70–99)
Potassium: 3.7 mmol/L (ref 3.5–5.1)
Sodium: 138 mmol/L (ref 135–145)
Total Bilirubin: 0.5 mg/dL (ref 0.0–1.2)
Total Protein: 4.9 g/dL — ABNORMAL LOW (ref 6.5–8.1)

## 2024-01-01 MED ORDER — DIPHENHYDRAMINE HCL 50 MG/ML IJ SOLN: 25.0000 mg | Freq: Once | INTRAMUSCULAR | Status: DC | PRN

## 2024-01-01 MED ORDER — SODIUM CHLORIDE 0.9 % IV SOLN
500.0000 mg | Freq: Once | INTRAVENOUS | Status: AC
  Administered 2024-01-01: 500 mg via INTRAVENOUS
  Filled 2024-01-01: qty 25

## 2024-01-01 MED ORDER — SODIUM CHLORIDE 0.9 % IV BOLUS
500.0000 mL | Freq: Once | INTRAVENOUS | Status: DC | PRN
Start: 2024-01-01 — End: 2024-01-03

## 2024-01-01 MED ORDER — SODIUM CHLORIDE 0.9 % IV SOLN: INTRAVENOUS | Status: AC | PRN

## 2024-01-01 MED ORDER — METHYLPREDNISOLONE SODIUM SUCC 125 MG IJ SOLR: 125.0000 mg | Freq: Once | INTRAMUSCULAR | Status: DC | PRN

## 2024-01-01 MED ORDER — ALBUTEROL SULFATE (2.5 MG/3ML) 0.083% IN NEBU
2.5000 mg | INHALATION_SOLUTION | Freq: Once | RESPIRATORY_TRACT | Status: DC | PRN
Start: 2024-01-01 — End: 2024-01-03

## 2024-01-01 MED ORDER — EPINEPHRINE 0.3 MG/0.3ML IJ SOAJ
0.3000 mg | Freq: Once | INTRAMUSCULAR | Status: DC | PRN
Start: 2024-01-01 — End: 2024-01-03

## 2024-01-01 NOTE — Lactation Note (Signed)
 This note was copied from a baby's chart. Lactation Consultation Note  Patient Name: Sherry Shepherd [id number] Date: 01/01/2024 Age:39 hours Reason for consult: Follow-up assessment;Exclusive pumping and bottle feeding;Term;Maternal endocrine disorder  P2- MOB is pumping only and offering formula. MOB is not latching infant to breast. MOB reports feeling ill all day today and admits that she has been too ill to pump. MOB reports pumping last at 11 am. LC encouraged MOB to rest and pump again when she is feeling well enough to do so. LC reviewed the importance of breast stimulation and encouraged her to hand express or massage her breasts every so often to still give some stimulation to them until she is ready to pump again. MOB's RN came in to provide medication and fix her beeping pump. LC encouraged MOB to call for further assistance as needed.  Maternal Data Has patient been taught Hand Expression?: Yes Does the patient have breastfeeding experience prior to this delivery?: Yes  Feeding Mother's Current Feeding Choice: Breast Milk and Formula Nipple Type: Slow - flow  Lactation Tools Discussed/Used Pump Education: Milk Storage  Interventions Interventions: Breast feeding basics reviewed;Hand pump;DEBP;Education;LC Services brochure  Discharge Discharge Education: Engorgement and breast care;Warning signs for feeding baby Pump: DEBP;Personal (Spectra )  Consult Status Consult Status: Follow-up Date: 01/02/24 Follow-up type: In-patient    Vernette Goo BS, IBCLC 01/01/2024, 5:57 PM

## 2024-01-01 NOTE — Plan of Care (Signed)

## 2024-01-01 NOTE — Progress Notes (Signed)
 Subjective: Postpartum Day 1: Cesarean Delivery Patient is doing okay this morning. Reports primarily bothered by gas pain, radiating to right upper abdomen and right shoulder. Incisional pain is controlled. Foley just removed this AM, has not yet voided or ambulated. Tolerating PO. Minimal lochia. Breastfeeding.   Objective: Patient Vitals for the past 24 hrs:  BP Temp Temp src Pulse Resp SpO2  01/01/24 0630 107/62 98 F (36.7 C) Oral 82 18 99 %  01/01/24 0230 101/67 98 F (36.7 C) Oral 87 18 100 %  12/31/23 2230 109/78 98 F (36.7 C) Oral 87 18 98 %  12/31/23 1833 101/71 97.7 F (36.5 C) Oral 84 20 98 %  12/31/23 1740 100/67 (!) 97.5 F (36.4 C) Oral 96 20 98 %  12/31/23 1639 99/68 98.1 F (36.7 C) Oral 86 20 97 %  12/31/23 1523 102/68 98.2 F (36.8 C) Oral 78 20 98 %  12/31/23 1503 103/77 -- -- 88 (!) 24 96 %  12/31/23 1445 99/67 97.6 F (36.4 C) Oral 79 15 98 %  12/31/23 1430 100/80 -- -- 79 19 (!) 86 %  12/31/23 1420 -- -- -- 76 19 97 %  12/31/23 1415 109/68 98.1 F (36.7 C) Oral 85 19 98 %     Physical Exam:  General: alert, cooperative, and no distress Lochia: appropriate Uterine Fundus: firm Incision: Prevena wound vac in place with dry tubing/canister DVT Evaluation: No evidence of DVT seen on physical exam.  Recent Labs    12/29/23 1251 01/01/24 0603  HGB 11.8* 7.9*  HCT 36.0 24.1*    Assessment/Plan: Arvin Laundry G2P2002 POD#1 sp PCS at [redacted]w[redacted]d, breech 1. PPC: routine PP care, encourage ambulation, follow up void  2. Gas pain: encourage ambulation, gas x prn, chewing gum 3. Hypothyroid: continue home synthroid  25mcg 4. Acute blood loss anemia, clinically significant: IV iron   5. Dispo: likely discharge home POD 2 vs 3  Theodoro Fisherman 01/01/2024, 10:48 AM

## 2024-01-01 NOTE — Progress Notes (Signed)
 Patient is complaining of feeling lightheaded and dizzy when sitting or standing, MD made aware, labs ordered

## 2024-01-02 MED ORDER — OXYCODONE HCL 5 MG PO TABS: 5.0000 mg | ORAL_TABLET | ORAL | 0 refills | Status: DC | PRN

## 2024-01-02 MED ORDER — IBUPROFEN 600 MG PO TABS: 600.0000 mg | ORAL_TABLET | Freq: Three times a day (TID) | ORAL | 0 refills | Status: DC

## 2024-01-02 MED ORDER — OXYCODONE HCL 5 MG PO CAPS: 5.0000 mg | ORAL_CAPSULE | ORAL | 0 refills | Status: DC | PRN

## 2024-01-02 MED ORDER — GABAPENTIN 100 MG PO CAPS: 200.0000 mg | ORAL_CAPSULE | Freq: Two times a day (BID) | ORAL | 0 refills | Status: DC | PRN

## 2024-01-02 MED ORDER — ACETAMINOPHEN 325 MG PO TABS: 650.0000 mg | ORAL_TABLET | Freq: Three times a day (TID) | ORAL | 0 refills | Status: DC

## 2024-01-02 MED ORDER — GABAPENTIN 100 MG PO CAPS: 100.0000 mg | ORAL_CAPSULE | Freq: Two times a day (BID) | ORAL | 0 refills | Status: DC | PRN

## 2024-01-02 NOTE — Discharge Instructions (Addendum)
 Call office with any concerns [phone] WHAT TO LOOK OUT FOR: Fever of 100.4 or above Mastitis: feels like flu and breasts hurt Infection: increased pain, swelling or redness Blood clots golf ball size or larger Postpartum depression Pre Eclampsia Weight gain more than 5lbs in a week Blurry vision or seeing spots Increased swelling  Stomach pain under right breast   Congratulations on your newest addition!

## 2024-01-02 NOTE — Discharge Summary (Addendum)
 Postpartum Discharge Summary  Date of Service updated     Patient Name: Sherry Shepherd DOB: Nov 25, 1985 MRN: [id number]  Date of admission: 12/31/2023 Delivery date:12/31/2023 Delivering provider: Alroy Jericho A Date of discharge: 01/02/2024  Admitting diagnosis: Post-operative state [Z98.890] Intrauterine pregnancy: [redacted]w[redacted]d     Secondary diagnosis:  Principal Problem:   Post-operative state  Additional problems: Fetal growth restrictions, gestational diabetes A1, IVF pregnancy, hashimoto's thyroiditis, asthma in pregnancy    Discharge diagnosis: Term Pregnancy Delivered, GDM A1, and IVF pregnancy, hashimoto thyroiditis                                              Post partum procedures:N/A Augmentation: N/A Complications: None  Hospital course: Sceduled C/S   38 y.o. yo G2P2002 at [redacted]w[redacted]d was admitted to the hospital 12/31/2023 for scheduled cesarean section with the following indication:prior anal sphincter injury with poor wound healing.Delivery details are as follows:  Membrane Rupture Time/Date: 12:59 PM,12/31/2023  Delivery Method:C-Section, Low Transverse Operative Delivery:N/A Details of operation can be found in separate operative note.  Patient had an uncomplicated postpartum course. She is ambulating, tolerating a regular diet, passing flatus, and urinating well. Patient is discharged home in stable condition on  01/02/24        Newborn Data: Birth date:12/31/2023 Birth time:1:00 PM Gender:Female Living status:Living Apgars:8 ,8  Weight:3020 g    Magnesium  Sulfate received: No BMZ received: No Rhophylac:N/A MMR:N/A T-DaP:Given prenatally Flu: No RSV Vaccine received: No Transfusion:No Immunizations administered: Immunization History  Administered Date(s) Administered   HPV Quadrivalent 10/07/2011   Influenza, Seasonal, Injecte, Preservative Fre 06/10/2023   Influenza,inj,Quad PF,6+ Mos 05/20/2016, 09/13/2018, 04/14/2019   Influenza-Unspecified 05/20/2016,  06/04/2016, 04/14/2019, 05/18/2020   PFIZER(Purple Top)SARS-COV-2 Vaccination 08/16/2019, 09/06/2019, 05/10/2020   Pfizer(Comirnaty )Fall Seasonal Vaccine 12 years and older 08/21/2022   Tdap 06/05/2011, 03/20/2015    Physical exam  Vitals:   01/01/24 1649 01/01/24 2006 01/02/24 0451 01/02/24 1238  BP: 98/63 105/67 103/70 94/66  Pulse: 80 94 82 80  Resp: 18 18 18 17   Temp:  97.6 F (36.4 C) 98.5 F (36.9 C) 98.1 F (36.7 C)  TempSrc:  Oral Oral Oral  SpO2: 100% 96% 97% 98%  Weight:      Height:       General: alert, cooperative, and no distress Lochia: appropriate Uterine Fundus: firm Incision: Dressing is clean, dry, and intact DVT Evaluation: No evidence of DVT seen on physical exam. Labs: Lab Results  Component Value Date   WBC 8.5 01/01/2024   HGB 7.8 (L) 01/01/2024   HCT 23.4 (L) 01/01/2024   MCV 89.7 01/01/2024   PLT 174 01/01/2024      Latest Ref Rng & Units 01/01/2024    5:23 PM  CMP  Glucose 70 - 99 mg/dL 92   BUN 6 - 20 mg/dL 13   Creatinine 8.11 - 1.00 mg/dL 9.14   Sodium 782 - 956 mmol/L 138   Potassium 3.5 - 5.1 mmol/L 3.7   Chloride 98 - 111 mmol/L 107   CO2 22 - 32 mmol/L 21   Calcium 8.9 - 10.3 mg/dL 8.7   Total Protein 6.5 - 8.1 g/dL 4.9   Total Bilirubin 0.0 - 1.2 mg/dL 0.5   Alkaline Phos 38 - 126 U/L 48   AST 15 - 41 U/L 22   ALT 0 - 44 U/L 15  Edinburgh Score:    01/01/2024    5:00 PM  Edinburgh Postnatal Depression Scale Screening Tool  I have been able to laugh and see the funny side of things. 0  I have looked forward with enjoyment to things. 0  I have blamed myself unnecessarily when things went wrong. 1  I have been anxious or worried for no good reason. 2  I have felt scared or panicky for no good reason. 1  Things have been getting on top of me. 1  I have been so unhappy that I have had difficulty sleeping. 0  I have felt sad or miserable. 0  I have been so unhappy that I have been crying. 0  The thought of harming myself  has occurred to me. 0  Edinburgh Postnatal Depression Scale Total 5      After visit meds:  Allergies as of 01/02/2024       Reactions   Amoxicillin Hives   Cefprozil Hives        Medication List     STOP taking these medications    clobetasol  ointment 0.05 % Commonly known as: TEMOVATE    loperamide  2 MG capsule Commonly known as: IMODIUM    omeprazole  40 MG capsule Commonly known as: PRILOSEC   OneTouch Delica Plus Lancet33G Misc   OneTouch Verio Flex System w/Device Kit   OneTouch Verio test strip Generic drug: glucose blood       TAKE these medications    acetaminophen  325 MG tablet Commonly known as: Tylenol  Take 2 tablets (650 mg total) by mouth 3 (three) times daily before meals.   albuterol  108 (90 Base) MCG/ACT inhaler Commonly known as: VENTOLIN  HFA Inhale 1-2 puffs into the lungs every 4 (four) hours as needed for shortness of breath (or cough).   Breztri  Aerosphere 160-9-4.8 MCG/ACT Aero inhaler Generic drug: budesonide -glycopyrrolate -formoterol  Inhale 2 puffs into the lungs in the morning and at bedtime.   D3-50 1.25 MG (50000 UT) capsule Generic drug: Cholecalciferol  Take 1 capsule (1.25 mg total) by mouth once a week. What changed: additional instructions   gabapentin  100 MG capsule Commonly known as: NEURONTIN  Take 1 capsule (100 mg total) by mouth 2 (two) times daily as needed (Incisional pain).   ibuprofen  600 MG tablet Commonly known as: ADVIL  Take 1 tablet (600 mg total) by mouth 3 (three) times daily.   levothyroxine  25 MCG tablet Commonly known as: SYNTHROID  Take 1 tablet (25 mcg total) by mouth daily for 30 days.   optichamber diamond Misc Use as directed with inhaler.   oxyCODONE  5 MG immediate release tablet Commonly known as: Oxy IR/ROXICODONE  Take 1 tablet (5 mg total) by mouth every 4 (four) hours as needed for moderate pain (pain score 4-6).   PRENATAL VITAMINS PO Take by mouth.               Discharge  Care Instructions  (From admission, onward)           Start     Ordered   01/02/24 0000  Discharge wound care:       Comments: Remove on date marked on bandage   01/02/24 1506             Discharge home in stable condition Infant Feeding: Bottle and Breast Infant Disposition:home with mother Discharge instruction: per After Visit Summary and Postpartum booklet. Activity: Advance as tolerated. Pelvic rest for 6 weeks.  Diet: routine diet Anticipated Birth Control: Unsure Postpartum Appointment:6 weeks Additional Postpartum F/U: Postpartum Depression checkup,  2 hour GTT, and Incision check 2 weeks Future Appointments:No future appointments. Follow up Visit:  Follow-up Information     Ob/Gyn, Tita Form. Schedule an appointment as soon as possible for a visit in 2 week(s).   Contact information: [address] [place] [address] [phone]                1805: Patient discharged at 1500. Received call from RN Katelynn Viars patient still present at hospital. Request resending of narcotic RX to 24 hour pharmacy as original pharmacy closed. RX sent, will notify original pharmacy 6/1 upon opening to not fill RX  2000: Patient called after-hours line. She was unable to get non-narcotic meds transferred as previously stated to RN. Requests resending NSAIDs/gabapentin  to 24 hour pharmacy. Resent    01/02/2024 Leanne Pronto, DO

## 2024-01-02 NOTE — Lactation Note (Signed)
 This note was copied from a baby's chart. Lactation Consultation Note  Patient Name: Sherry Shepherd [id number] Date: 01/02/2024 Age:39 hours  Reason for consult: Follow-up assessment;Exclusive pumping and bottle feeding;Maternal endocrine disorder  P2, [redacted]w[redacted]d, 5.6% weight loss  Mother is exclusively pumping and formula supplementing per choice. She reports feeling poorly post op and receiving iron  yesterday but feeling better today. She plans to resume pumping every 3 hours today, getting out of bed and becoming more active. She is hopeful for discharge to home today.   Mother has experience with breastfeeding and  engorgement with her first child. She ultimately decided to express her milk and  pumped for 10 months with success.   Mom made aware of O/P services, breastfeeding/ pumping support groups, community resources, and our phone # for post-discharge questions.    Feeding Mother's Current Feeding Choice: Formula Nipple Type: Slow - flow   Interventions Interventions: Education  Discharge Pump: DEBP;Personal  Consult Status Consult Status: Complete Date: 01/02/24    Esperanza Hedges 01/02/2024, 12:11 PM

## 2024-01-02 NOTE — Progress Notes (Addendum)
 Subjective: Postpartum Day 2: Cesarean Delivery Dizziness improved some after IV iron  yesterday, has returned this AM but less intense. Ambulating some. +flatus, diarrhea yesterday. Normal lochia. Pain controlled with medication    Objective: Vital signs in last 24 hours: Temp:  [97.6 F (36.4 C)-98.5 F (36.9 C)] 98.5 F (36.9 C) (05/31 0451) Pulse Rate:  [80-94] 82 (05/31 0451) Resp:  [17-18] 18 (05/31 0451) BP: (88-107)/(57-80) 103/70 (05/31 0451) SpO2:  [96 %-100 %] 97 % (05/31 0451)  Physical Exam:  General: alert, cooperative, and no distress Lochia: appropriate Uterine Fundus: firm, 1 cm inferior to umbilicus. Subcutaneous abdominal swelling Incision: Prevena in place, canister dry DVT Evaluation: No evidence of DVT seen on physical exam. No significant calf/ankle edema.  Recent Labs    01/01/24 0603 01/01/24 1723  HGB 7.9* 7.8*  HCT 24.1* 23.4*    Assessment/Plan: Sherry Shepherd G2P2002 POD#2 sp PCS at [redacted]w[redacted]d, breech 1. PPC: routine PP care. Encouraged ambulation. IS teaching provided 2. Hypothyroid: continue home synthroid  25mcg 4. Acute blood loss anemia, clinically significant: IV iron  x 1 5/31. Will reassess today with more ambulation, possible blood transfusion if symptomatic 5. Dispo: consider DC later today vs more likely tomorrow pending clinical improvement  1500: Patient feeling better, requests discharge home today  Leanne Pronto, DO 01/02/2024, 8:14 AM

## 2024-01-04 ENCOUNTER — Other Ambulatory Visit (HOSPITAL_BASED_OUTPATIENT_CLINIC_OR_DEPARTMENT_OTHER): Payer: Self-pay

## 2024-01-04 ENCOUNTER — Telehealth: Payer: Self-pay

## 2024-01-06 ENCOUNTER — Other Ambulatory Visit (HOSPITAL_BASED_OUTPATIENT_CLINIC_OR_DEPARTMENT_OTHER): Payer: Self-pay

## 2024-01-06 MED ORDER — GABAPENTIN 100 MG PO CAPS
200.0000 mg | ORAL_CAPSULE | Freq: Two times a day (BID) | ORAL | 0 refills | Status: DC
  Filled 2024-01-06: qty 28, 7d supply, fill #0

## 2024-01-06 NOTE — Telephone Encounter (Signed)
 error

## 2024-01-09 ENCOUNTER — Telehealth (HOSPITAL_COMMUNITY): Payer: Self-pay | Admitting: *Deleted

## 2024-01-09 NOTE — Telephone Encounter (Signed)
 01/09/2024  Name: Sherry Shepherd MRN: [id number] DOB: May 30, 1986  Reason for Call:  Transition of Care Hospital Discharge Call  Contact Status: Patient Contact Status: Message  Language assistant needed:          Follow-Up Questions:    Dimple Francis Postnatal Depression Scale:  In the Past 7 Days:    PHQ2-9 Depression Scale:     Discharge Follow-up:    Post-discharge interventions: NA  Pearlie Bougie, RN 01/09/2024 13:40

## 2024-01-26 ENCOUNTER — Other Ambulatory Visit (HOSPITAL_BASED_OUTPATIENT_CLINIC_OR_DEPARTMENT_OTHER): Payer: Self-pay

## 2024-01-26 MED ORDER — CLINDAMYCIN HCL 150 MG PO CAPS
450.0000 mg | ORAL_CAPSULE | Freq: Three times a day (TID) | ORAL | 0 refills | Status: DC
  Filled 2024-01-26: qty 63, 7d supply, fill #0

## 2024-01-28 ENCOUNTER — Other Ambulatory Visit (HOSPITAL_BASED_OUTPATIENT_CLINIC_OR_DEPARTMENT_OTHER): Payer: Self-pay

## 2024-02-15 ENCOUNTER — Other Ambulatory Visit (HOSPITAL_BASED_OUTPATIENT_CLINIC_OR_DEPARTMENT_OTHER): Payer: Self-pay

## 2024-02-15 ENCOUNTER — Other Ambulatory Visit: Payer: Self-pay

## 2024-03-16 ENCOUNTER — Other Ambulatory Visit (HOSPITAL_BASED_OUTPATIENT_CLINIC_OR_DEPARTMENT_OTHER): Payer: Self-pay

## 2024-05-26 ENCOUNTER — Ambulatory Visit (INDEPENDENT_AMBULATORY_CARE_PROVIDER_SITE_OTHER)

## 2024-05-26 VITALS — BP 98/58 | HR 92 | Wt 170.0 lb

## 2024-05-26 DIAGNOSIS — J029 Acute pharyngitis, unspecified: Secondary | ICD-10-CM | POA: Diagnosis not present

## 2024-05-26 LAB — POC SOFIA 2 FLU + SARS ANTIGEN FIA
Influenza A, POC: NEGATIVE
Influenza B, POC: NEGATIVE
SARS Coronavirus 2 Ag: NEGATIVE

## 2024-05-26 LAB — POCT RAPID STREP A (OFFICE): Rapid Strep A Screen: NEGATIVE

## 2024-05-26 NOTE — Progress Notes (Signed)
    SUBJECTIVE:   CHIEF COMPLAINT / HPI:   Laurinda is a 38yo F who presents to the [facility] with primary complaint of sore throat. Patient states symptoms began last night with an itchy throat. This morning she woke up and her throat felt like it was burning. She reports nasal congestion, HA, malaise, and body aches. She does not know if she has been running fevers since she has been taking 1000mg  Tylenol  around the clock. Denies CP, SOB, N/V/D, ear pain, or sick contacts. Her daughter goes to school and her son attends daycare but they are not displaying any symptoms.   PERTINENT  PMH / PSH: asthma, hypothyroidism  OBJECTIVE:   BP (!) 98/58   Pulse 92   Wt 170 lb (77.1 kg)   LMP 05/21/2024   SpO2 97%   BMI 32.12 kg/m    General: A&O, NAD, non-toxic appearing HEENT: No sign of trauma, EOM grossly intact, clear nares, clear oropharynx without erythema, edema, exudate, or petechiae Cardiac: RRR, no m/r/g Respiratory: CTAB, normal WOB, no w/c/r, good air movement throughout all lung fields  GI: Soft, NTTP, non-distended, no masses Extremities: NTTP, no peripheral edema. Neuro: Normal gait, moves all four extremities appropriately. Psych: Appropriate mood and affect, pleasant  ASSESSMENT/PLAN:   Assessment & Plan Sore throat COVID, flu, and strep swabs negative, however patient is being seen very early in infection course. Most likely pharyngitis without evidence of petechiae or tonsillar edema.  - May use Chloraseptic spray and lozenges PRN for throat pain - Emphasized hand hygiene - Return for symptoms that do not improve or worsen.  Camie Dixons, DO [facility]

## 2024-05-26 NOTE — Patient Instructions (Signed)
 It was wonderful to see you today.  Please bring ALL of your medications with you to every visit.   Today we talked about:  Pharyngitis.  You can take Chloraseptic spray over-the-counter to help numb the throat and throat lozenges for relief.  I have attached some other tips for pain control with pharyngitis.  Return to the office for symptoms that worsen or do not improve.  Thank you for choosing [facility].   Please call [phone] with any questions about today's appointment.  Please arrive at least 15 minutes prior to your scheduled appointments.   If you had blood work today, I will send you a MyChart message or a letter if results are normal. Otherwise, I will give you a call.   If you had a referral placed, they will call you to set up an appointment. Please give us  a call if you don't hear back in the next 2 weeks.   If you need additional refills before your next appointment, please call your pharmacy first.   You should follow up in our clinic in No follow-ups on file.  Camie Dixons, DO Family Medicine

## 2024-07-04 ENCOUNTER — Other Ambulatory Visit (HOSPITAL_BASED_OUTPATIENT_CLINIC_OR_DEPARTMENT_OTHER): Payer: Self-pay

## 2024-07-04 MED ORDER — LOESTRIN FE 1/20 1-20 MG-MCG PO TABS
1.0000 | ORAL_TABLET | Freq: Every day | ORAL | 1 refills | Status: AC
  Filled 2024-07-04: qty 84, 84d supply, fill #0

## 2024-07-04 MED ORDER — CLOBETASOL PROPIONATE 0.05 % EX OINT
TOPICAL_OINTMENT | CUTANEOUS | 2 refills | Status: AC
  Filled 2024-07-04: qty 60, 30d supply, fill #0

## 2024-07-15 ENCOUNTER — Other Ambulatory Visit: Payer: Self-pay

## 2024-07-15 ENCOUNTER — Other Ambulatory Visit (HOSPITAL_BASED_OUTPATIENT_CLINIC_OR_DEPARTMENT_OTHER): Payer: Self-pay

## 2024-07-15 MED ORDER — BECLOMETHASONE DIPROP HFA 40 MCG/ACT IN AERB
1.0000 | INHALATION_SPRAY | Freq: Two times a day (BID) | RESPIRATORY_TRACT | 5 refills | Status: AC
  Filled 2024-07-15: qty 10.6, 30d supply, fill #0

## 2024-07-15 MED ORDER — OLOPATADINE HCL 0.2 % OP SOLN
OPHTHALMIC | 5 refills | Status: AC
  Filled 2024-07-15: qty 2.5, 30d supply, fill #0

## 2024-07-18 ENCOUNTER — Encounter: Payer: Self-pay | Admitting: Family Medicine

## 2024-07-18 ENCOUNTER — Other Ambulatory Visit (HOSPITAL_BASED_OUTPATIENT_CLINIC_OR_DEPARTMENT_OTHER): Payer: Self-pay

## 2024-07-18 ENCOUNTER — Other Ambulatory Visit: Payer: Self-pay

## 2024-07-18 ENCOUNTER — Ambulatory Visit: Admitting: Family Medicine

## 2024-07-18 VITALS — BP 122/81 | HR 87 | Ht 61.0 in | Wt 175.8 lb

## 2024-07-18 DIAGNOSIS — Z23 Encounter for immunization: Secondary | ICD-10-CM

## 2024-07-18 DIAGNOSIS — E063 Autoimmune thyroiditis: Secondary | ICD-10-CM

## 2024-07-18 DIAGNOSIS — M62838 Other muscle spasm: Secondary | ICD-10-CM

## 2024-07-18 DIAGNOSIS — E66811 Obesity, class 1: Secondary | ICD-10-CM

## 2024-07-18 MED ORDER — LEVOTHYROXINE SODIUM 25 MCG PO TABS
25.0000 ug | ORAL_TABLET | Freq: Every day | ORAL | 3 refills | Status: AC
  Filled 2024-07-18: qty 90, 90d supply, fill #0

## 2024-07-18 NOTE — Patient Instructions (Signed)
 It was great to see you again today.  Call the [facility] to set up an appointment for weight management. Their number is [phone].   Checking cholesterol, kidneys, liver, magnesium  Ordered thyroid  US   Pneumonia shot today  Be well, Dr. Donah

## 2024-07-18 NOTE — Progress Notes (Unsigned)
°  Date of Visit: 07/18/2024   SUBJECTIVE:   HPI:  Sherry Shepherd presents today for a well woman exam.   Concerns today: *** Periods: *** Contraception: *** Pelvic symptoms: *** Sexual activity: *** STD Screening: *** Pap smear status: *** Exercise: *** Diet: *** Smoking: *** Alcohol: *** Drugs: *** Advance directives: *** Mood: *** Dentist: *** Cancers in family: ***  OBJECTIVE:   BP 122/81   Pulse 87   Ht 5' 1 (1.549 m)   Wt 175 lb 12.8 oz (79.7 kg)   LMP 07/09/2024   SpO2 96%   Breastfeeding No   BMI 33.22 kg/m  Gen: NAD, pleasant, cooperative HEENT: NCAT, PERRL, no palpable thyromegaly or anterior cervical lymphadenopathy Heart: RRR, no murmurs Lungs: CTAB, NWOB Abdomen: soft, nontender to palpation Neuro: grossly nonfocal, speech normal GU: normal appearing external genitalia without lesions. Vagina is moist with white discharge. Cervix normal in appearance. No cervical motion tenderness or tenderness on bimanual exam. No adnexal masses.   ASSESSMENT/PLAN:   Health maintenance:  -STD screening: *** -pap smear: *** -mammogram: *** -lipid screening: *** -colon cancer screening: *** -lung cancer screening: *** -DEXA: *** -immunizations:  Flu: *** Tdap: *** Shingrix: *** COVID: *** Pneumovax: *** -advance directives: *** -handout given on health maintenance topics  Assessment & Plan Hashimoto's thyroiditis  Muscle spasm  Class 1 obesity without serious comorbidity with body mass index (BMI) of 33.0 to 33.9 in adult, unspecified obesity type   FOLLOW UP: Follow up in *** for ***  Saray Capasso J. Donah, MD [facility]

## 2024-07-19 ENCOUNTER — Ambulatory Visit: Payer: Self-pay | Admitting: Family Medicine

## 2024-07-19 LAB — LIPID PANEL
Chol/HDL Ratio: 3.9 ratio (ref 0.0–4.4)
Cholesterol, Total: 177 mg/dL (ref 100–199)
HDL: 45 mg/dL (ref >39)
LDL Chol Calc (NIH): 66 mg/dL (ref 0–99)
Triglycerides: 424 mg/dL — ABNORMAL HIGH (ref 0–149)
VLDL Cholesterol Cal: 66 mg/dL — ABNORMAL HIGH (ref 5–40)

## 2024-07-19 LAB — CMP14+EGFR
ALT: 28 IU/L (ref 0–32)
AST: 18 IU/L (ref 0–40)
Albumin: 4.7 g/dL (ref 3.9–4.9)
Alkaline Phosphatase: 74 IU/L (ref 41–116)
BUN/Creatinine Ratio: 14 (ref 9–23)
BUN: 10 mg/dL (ref 6–20)
Bilirubin Total: 0.3 mg/dL (ref 0.0–1.2)
CO2: 23 mmol/L (ref 20–29)
Calcium: 9.6 mg/dL (ref 8.7–10.2)
Chloride: 100 mmol/L (ref 96–106)
Creatinine, Ser: 0.7 mg/dL (ref 0.57–1.00)
Globulin, Total: 2.9 g/dL (ref 1.5–4.5)
Glucose: 81 mg/dL (ref 70–99)
Potassium: 4.1 mmol/L (ref 3.5–5.2)
Sodium: 139 mmol/L (ref 134–144)
Total Protein: 7.6 g/dL (ref 6.0–8.5)
eGFR: 113 mL/min/1.73 (ref >59)

## 2024-07-19 LAB — MAGNESIUM: Magnesium: 2.1 mg/dL (ref 1.6–2.3)

## 2024-07-20 DIAGNOSIS — J45909 Unspecified asthma, uncomplicated: Secondary | ICD-10-CM | POA: Insufficient documentation

## 2024-07-20 NOTE — Assessment & Plan Note (Signed)
 Recent flare-up postpartum, previously in remission during pregnancy. Currently using clobetasol  for maintenance. - Continue clobetasol  daily for three months as per OB's recommendation.

## 2024-07-20 NOTE — Assessment & Plan Note (Signed)
 Well-managed on levothyroxine  with normal thyroid  function tests. Previous ultrasound showed diffuse enlargement. Discussed option of ordering a thyroid  ultrasound. - Ordered thyroid  ultrasound. - Refilled levothyroxine  25 mcg with a 90-day supply and three refills.

## 2024-07-20 NOTE — Assessment & Plan Note (Signed)
 Adult-onset asthma with recent exacerbation postpartum. Allergy testing revealed multiple environmental allergies. Prescribed Qvar  and Singulair, not yet started due to pharmacy issues. Discussed variability in response to Singulair. - Follow up with pharmacy to resolve prescription issues for Qvar  and Singulair. - Administered pneumonia vaccine due to asthma diagnosis.

## 2024-07-20 NOTE — Assessment & Plan Note (Signed)
 Long-standing GERD managed with omeprazole . Previous endoscopy in 2020. Surgery not pursued due to effective medical management. - Continue omeprazole  40 mg daily.

## 2024-07-27 ENCOUNTER — Other Ambulatory Visit (HOSPITAL_BASED_OUTPATIENT_CLINIC_OR_DEPARTMENT_OTHER): Payer: Self-pay

## 2024-08-13 ENCOUNTER — Emergency Department (HOSPITAL_COMMUNITY)

## 2024-08-13 ENCOUNTER — Emergency Department (HOSPITAL_COMMUNITY)
Admission: EM | Admit: 2024-08-13 | Discharge: 2024-08-13 | Disposition: A | Discharge status: Other Acute Inpatient Hospital | Attending: Emergency Medicine | Admitting: Emergency Medicine

## 2024-08-13 ENCOUNTER — Other Ambulatory Visit: Payer: Self-pay

## 2024-08-13 ENCOUNTER — Encounter (HOSPITAL_COMMUNITY): Payer: Self-pay

## 2024-08-13 DIAGNOSIS — S42432A Displaced fracture (avulsion) of lateral epicondyle of left humerus, initial encounter for closed fracture: Secondary | ICD-10-CM | POA: Insufficient documentation

## 2024-08-13 DIAGNOSIS — W1839XA Other fall on same level, initial encounter: Secondary | ICD-10-CM | POA: Diagnosis not present

## 2024-08-13 DIAGNOSIS — S42492A Other displaced fracture of lower end of left humerus, initial encounter for closed fracture: Secondary | ICD-10-CM

## 2024-08-13 DIAGNOSIS — S59902A Unspecified injury of left elbow, initial encounter: Secondary | ICD-10-CM | POA: Diagnosis present

## 2024-08-13 DIAGNOSIS — S53105A Unspecified dislocation of left ulnohumeral joint, initial encounter: Secondary | ICD-10-CM

## 2024-08-13 MED ORDER — HYDROMORPHONE HCL 1 MG/ML IJ SOLN
1.0000 mg | Freq: Once | INTRAMUSCULAR | Status: AC
  Administered 2024-08-13: 1 mg via INTRAVENOUS
  Filled 2024-08-13: qty 1

## 2024-08-13 MED ORDER — FENTANYL CITRATE (PF) 50 MCG/ML IJ SOSY
100.0000 ug | PREFILLED_SYRINGE | Freq: Once | INTRAMUSCULAR | Status: AC
  Administered 2024-08-13: 100 ug via INTRAVENOUS
  Filled 2024-08-13: qty 2

## 2024-08-13 MED ORDER — ONDANSETRON HCL 4 MG/2ML IJ SOLN
4.0000 mg | Freq: Once | INTRAMUSCULAR | Status: AC
  Administered 2024-08-13: 4 mg via INTRAVENOUS
  Filled 2024-08-13: qty 2

## 2024-08-13 MED ORDER — PROPOFOL 10 MG/ML IV BOLUS
INTRAVENOUS | Status: AC | PRN
  Administered 2024-08-13: 50 mg via INTRAVENOUS
  Administered 2024-08-13 (×2): 20 mg via INTRAVENOUS
  Administered 2024-08-13: 30 mg via INTRAVENOUS
  Administered 2024-08-13: 20 mg via INTRAVENOUS

## 2024-08-13 MED ORDER — PROPOFOL 10 MG/ML IV BOLUS
40.0000 mg | Freq: Once | INTRAVENOUS | Status: DC
  Filled 2024-08-13: qty 20

## 2024-08-13 NOTE — ED Provider Notes (Signed)
 " Sherry Shepherd Provider Note   CSN: [id number] Arrival date & time: 08/13/24  0206     Patient presents with: Dislocation   Sherry Shepherd is a 39 y.o. female.   39 yo F with a chief complaint of left elbow pain.  The patient was tending to her childhood spit up in the night she did go to pick them up and she lost her balance and fell to the side.  She had severe pain to the left mid arm.  She feels like she has some numbness to the ulnar aspect of the hand and has had trouble moving her fingers.  She denies other specific injury.        Prior to Admission medications  Medication Sig Start Date End Date Taking? Authorizing Provider  albuterol  (VENTOLIN  HFA) 108 (90 Base) MCG/ACT inhaler Inhale 1-2 puffs into the lungs every 4 (four) hours as needed for shortness of breath (or cough). 04/30/22   Nanavati, Ankit, MD  beclomethasone (QVAR ) 40 MCG/ACT inhaler Inhale 1 puff into the lungs 2 (two) times daily. 07/15/24     cetirizine (ZYRTEC) 10 MG tablet Take 10 mg by mouth daily.    [provider]  Cholecalciferol  25 MCG (1000 UT) tablet Take 2,000 Units by mouth daily.    [provider]  clobetasol  ointment (TEMOVATE ) 0.05 % Apply topically a thin layer to affected area twice a week. 07/04/24     levothyroxine  (SYNTHROID ) 25 MCG tablet Take 1 tablet (25 mcg total) by mouth daily. 07/18/24   Donah Laymon PARAS, MD  norethindrone -ethinyl estradiol -FE (LOESTRIN  FE 1/20) 1-20 MG-MCG tablet Take 1 tablet by mouth daily. 07/04/24     Olopatadine  HCl 0.2 % SOLN 1 drop into affected eye Ophthalmic Once a day 07/15/24     omeprazole  (PRILOSEC) 20 MG capsule Take 40 mg by mouth daily.    [provider]  Prenatal Vit-Fe Fumarate-FA (PRENATAL VITAMINS PO) Take by mouth.    [provider]  Spacer/Aero-Holding Raguel FRENCH Use as directed with inhaler. 05/06/22   Hunsucker, Donnice SAUNDERS, MD    Allergies: Amoxicillin and  Cefprozil    Review of Systems  Updated Vital Signs BP (!) 120/90   Pulse (!) 104   Temp 98.2 F (36.8 C)   Resp 15   Ht 5' 1 (1.549 m)   Wt 79.4 kg   LMP 07/09/2024   SpO2 100%   BMI 33.07 kg/m   Physical Exam Vitals and nursing note reviewed.  Constitutional:      General: She is not in acute distress.    Appearance: She is well-developed. She is not diaphoretic.  HENT:     Head: Normocephalic and atraumatic.  Eyes:     Pupils: Pupils are equal, round, and reactive to light.  Cardiovascular:     Rate and Rhythm: Normal rate and regular rhythm.     Heart sounds: No murmur heard.    No friction rub. No gallop.  Pulmonary:     Effort: Pulmonary effort is normal.     Breath sounds: No wheezing or rales.  Abdominal:     General: There is no distension.     Palpations: Abdomen is soft.     Tenderness: There is no abdominal tenderness.  Musculoskeletal:        General: Swelling and tenderness present.     Cervical back: Normal range of motion and neck supple.     Comments: Pain and swelling mostly  at the supracondylar region of the left arm.  Pulse intact.  Decree sensation along the ulnar aspect of the hand.  She appears to have intact sensation along the radial and medial nerve distribution.  She does seem to have some weakness or perhaps pain limited motion with the ulnar nerve.  Skin:    General: Skin is warm and dry.  Neurological:     Mental Status: She is alert and oriented to person, place, and time.  Psychiatric:        Behavior: Behavior normal.     (all labs ordered are listed, but only abnormal results are displayed) Labs Reviewed - No data to display  EKG: None  Radiology: DG Elbow 2 Views Left Result Date: 08/13/2024 EXAM: 1 or 2 VIEW(S) XRAY OF THE LEFT ELBOW COMPARISON: 08/13/2024. CLINICAL HISTORY: reduction FINDINGS: BONES AND JOINTS: Acute comminuted intra-articular fracture of the distal humerus, displaced laterally, although improved.  Interval elbow reduction. Associated joint effusion. SOFT TISSUES: Posterior splint noted, obscuring fine osseous detail. IMPRESSION: 1. Interval elbow reduction. 2. Improved alignment of the comminuted intra-articular distal humeral fracture. Electronically signed by: Pinkie Pebbles MD MD 08/13/2024 04:02 AM EST RP Workstation: [id number]   DG Elbow Complete Left Result Date: 08/13/2024 EXAM: 3 VIEW(S) XRAY OF THE LEFT ELBOW COMPARISON: None available. CLINICAL HISTORY: possible dislocation FINDINGS: BONES AND JOINTS: Acute markedly comminuted intra-articular, displaced and impacted fracture of the condyles, epicondyles, and metadiaphysis of the distal humerus. Elbow dislocation. Joint effusion. SOFT TISSUES: Subcutaneous soft tissue edema. IMPRESSION: 1. Elbow dislocation. 2. Acute markedly comminuted intra-articular, displaced and impacted fracture of the condyles, epicondyles, and metadiaphysis of the distal humerus. 3. Joint effusion. 4. Subcutaneous soft tissue edema. Electronically signed by: Morgane Naveau MD MD 08/13/2024 03:15 AM EST RP Workstation: [id number]   DG Shoulder Left Result Date: 08/13/2024 EXAM: 1 VIEW(S) XRAY OF THE LEFT SHOULDER 08/13/2024 03:07:00 AM COMPARISON: None available. CLINICAL HISTORY: possible dislocation FINDINGS: BONES AND JOINTS: Glenohumeral joint is normally aligned. No acute fracture. No malalignment. The [facility] joint is unremarkable. SOFT TISSUES: No abnormal calcifications. Visualized lung is unremarkable. IMPRESSION: 1. No evidence of shoulder dislocation. Electronically signed by: Morgane Naveau MD MD 08/13/2024 03:12 AM EST RP Workstation: [id number]     .Sedation  Date/Time: 08/13/2024 4:17 AM  Performed by: Emil Share, DO Authorized by: Emil Share, DO   Consent:    Consent obtained:  Verbal and written   Consent given by:  Patient   Risks discussed:  Allergic reaction, dysrhythmia, inadequate sedation, respiratory compromise necessitating ventilatory  assistance and intubation, nausea and vomiting   Alternatives discussed:  Analgesia without sedation Universal protocol:    Immediately prior to procedure, a time out was called: yes     Patient identity confirmed:  Verbally with patient Indications:    Procedure performed:  Dislocation reduction   Procedure necessitating sedation performed by:  Physician performing sedation Pre-sedation assessment:    Time since last food or drink:  6   ASA classification: class 1 - normal, healthy patient     Mouth opening:  3 or more finger widths   Thyromental distance:  4 finger widths   Mallampati score:  I - soft palate, uvula, fauces, pillars visible   Neck mobility: normal     Pre-sedation assessments completed and reviewed: airway patency, cardiovascular function, hydration status, mental status, nausea/vomiting, pain level, respiratory function and temperature   A pre-sedation assessment was completed prior to the start of the procedure Immediate pre-procedure details:  Reviewed: vital signs     Verified: bag valve mask available, emergency equipment available, intubation equipment available, IV patency confirmed and oxygen available   Procedure details (see MAR for exact dosages):    Preoxygenation:  Nasal cannula   Sedation:  Propofol    Intended level of sedation: moderate (conscious sedation)   Analgesia:  Fentanyl  and hydromorphone    Intra-procedure monitoring:  Blood pressure monitoring, cardiac monitor, continuous capnometry, continuous pulse oximetry, frequent LOC assessments and frequent vital sign checks   Intra-procedure events: none     Intra-procedure management:  Airway repositioning   Total Provider sedation time (minutes):  30 Post-procedure details:   A post-sedation assessment was completed following the completion of the procedure.   Attendance: Constant attendance by certified staff until patient recovered     Recovery: Patient returned to pre-procedure baseline      Post-sedation assessments completed and reviewed: airway patency, cardiovascular function, hydration status, mental status, nausea/vomiting, pain level, respiratory function and temperature     Patient is stable for discharge or admission: yes     Procedure completion:  Tolerated well, no immediate complications .Reduction of dislocation  Date/Time: 08/13/2024 4:19 AM  Performed by: Emil Share, DO Authorized by: Emil Share, DO  Consent: Written consent obtained Risks and benefits: risks, benefits and alternatives were discussed Consent given by: patient Patient understanding: patient states understanding of the procedure being performed Patient consent: the patient's understanding of the procedure matches consent given Imaging studies: imaging studies available Required items: required blood products, implants, devices, and special equipment available Patient identity confirmed: verbally with patient Time out: Immediately prior to procedure a time out was called to verify the correct patient, procedure, equipment, support staff and site/side marked as required. Preparation: Patient was prepped and draped in the usual sterile fashion. Local anesthesia used: no  Anesthesia: Local anesthesia used: no  Sedation: Patient sedated: yes Sedation type: moderate (conscious) sedation Sedatives: propofol  Analgesia: fentanyl  and hydromorphone  Vitals: Vital signs were monitored during sedation.  Patient tolerance: patient tolerated the procedure well with no immediate complications      Medications Ordered in the ED  propofol  (DIPRIVAN ) 10 mg/mL bolus/IV push 40 mg (40 mg Intravenous Not Given 08/13/24 0316)  HYDROmorphone  (DILAUDID ) injection 1 mg (1 mg Intravenous Given 08/13/24 0222)  ondansetron  (ZOFRAN ) injection 4 mg (4 mg Intravenous Given 08/13/24 0221)  fentaNYL  (SUBLIMAZE ) injection 100 mcg (100 mcg Intravenous Given 08/13/24 0247)  propofol  (DIPRIVAN ) 10 mg/mL bolus/IV push (20 mg  Intravenous Given 08/13/24 0258)  HYDROmorphone  (DILAUDID ) injection 1 mg (1 mg Intravenous Given 08/13/24 0429)                                    Medical Decision Making Amount and/or Complexity of Data Reviewed Radiology: ordered.  Risk Prescription drug management.   39 yo F with a chief complaint of left elbow pain.  This happened after a fall.  She does perhaps have some nerve involvement.  No break in the skin.  Will obtain a plain film.  Plain film on my independent interpretation concerning for a dislocation of the elbow with fracture of the lateral condyle of the distal humerus.  Plain film the shoulder my independent or potation without obvious fracture or dislocation.  Attempted reduction at bedside.  Some improved alignment anatomically though continues to have some persistent laxity and is a bit unstable.  Was splinted in place.  Repeat plain film  with good anterior motion of the arm but unfortunately persistent lateral dislocation.  Will discuss with hand.  I discussed case with Dr. Romona, hand surgery.  He independently reviewed the images.  He thought that the patient may benefit from an orthopedic trauma service.  Recommend I discussed case with physicians at [facility].  He thought the elbow could be more stable with additional splinting.  [facility]  Recommended ED to ED transfer.  Dr. Halvorson accepting.  I was notified that the patient had felt a pop in her elbow and had had some worsening pain.  Repeat x-ray without significant change to alignment.  I feel she stable for transfer.  The patients results and plan were reviewed and discussed.   Any x-rays performed were independently reviewed by myself.   Differential diagnosis were considered with the presenting HPI.  Medications  propofol  (DIPRIVAN ) 10 mg/mL bolus/IV push 40 mg (40 mg Intravenous Not Given 08/13/24 0316)  HYDROmorphone  (DILAUDID ) injection 1 mg (1 mg  Intravenous Given 08/13/24 0222)  ondansetron  (ZOFRAN ) injection 4 mg (4 mg Intravenous Given 08/13/24 0221)  fentaNYL  (SUBLIMAZE ) injection 100 mcg (100 mcg Intravenous Given 08/13/24 0247)  propofol  (DIPRIVAN ) 10 mg/mL bolus/IV push (20 mg Intravenous Given 08/13/24 0258)  HYDROmorphone  (DILAUDID ) injection 1 mg (1 mg Intravenous Given 08/13/24 0429)    Vitals:   08/13/24 0345 08/13/24 0400 08/13/24 0430 08/13/24 0453  BP: 118/70 (!) 120/94 (!) 145/89 (!) 120/90  Pulse: 86 (!) 111 (!) 132 (!) 104  Resp: 20 18 (!) 23 15  Temp:    98.2 F (36.8 C)  SpO2: 100% 100% 100% 100%  Weight:      Height:        Final diagnoses:  Elbow dislocation, left, initial encounter  Closed bicondylar fracture of distal end of left humerus, initial encounter         Final diagnoses:  Elbow dislocation, left, initial encounter  Closed bicondylar fracture of distal end of left humerus, initial encounter    ED Discharge Orders     None          Emil Share, DO 08/13/24 9461  "

## 2024-08-13 NOTE — ED Triage Notes (Signed)
 Pt BIB [facility] from home with L elbow deformity. Pt reports walking and falling on Right elbow an now can't move fingers or hand. PT reports numbness in some fingers.   EMS Vitals  150/80 BP

## 2024-08-13 NOTE — Progress Notes (Signed)
 RT NOTE: RT NOTE:  RT called to bedside for conscious sedation. AMBU bag and suction available at bedside. ETCo2 placed on patient. No respiratory intervention needed.

## 2024-08-13 NOTE — Progress Notes (Addendum)
 Orthopedic Tech Progress Note Patient Details:  Sherry Shepherd August 21, 1985 [id number] Sedation/ reduction performed w. Med team. Providers were successful with reattaching the dislocation. Pos. Long arm applied to prevent another event until f/u w. Ortho for further eval.  Ortho Devices Type of Ortho Device: Ace wrap, Cotton web roll, Arm sling, Post (long) splint Splint Material: Fiberglass Ortho Device/Splint Location: L ELBOW Ortho Device/Splint Interventions: Ordered, Application   Post Interventions Patient Tolerated: Well Instructions Provided: Care of device  Sammy Douthitt L Dorian Duval 08/13/2024, 3:19 AM

## 2024-08-17 MED FILL — Hydromorphone HCl Preservative Free (PF) Inj 2 MG/ML: INTRAMUSCULAR | Qty: 1 | Status: AC

## 2024-08-24 ENCOUNTER — Other Ambulatory Visit (HOSPITAL_COMMUNITY): Payer: Self-pay

## 2024-08-24 ENCOUNTER — Other Ambulatory Visit (HOSPITAL_BASED_OUTPATIENT_CLINIC_OR_DEPARTMENT_OTHER): Payer: Self-pay

## 2024-08-24 MED ORDER — GABAPENTIN 300 MG PO CAPS
600.0000 mg | ORAL_CAPSULE | Freq: Three times a day (TID) | ORAL | 0 refills | Status: DC
  Filled 2024-08-24 – 2024-08-25 (×2): qty 60, 10d supply, fill #0

## 2024-08-25 ENCOUNTER — Other Ambulatory Visit (HOSPITAL_COMMUNITY): Payer: Self-pay

## 2024-08-25 ENCOUNTER — Other Ambulatory Visit (HOSPITAL_BASED_OUTPATIENT_CLINIC_OR_DEPARTMENT_OTHER): Payer: Self-pay

## 2024-08-31 ENCOUNTER — Other Ambulatory Visit (HOSPITAL_BASED_OUTPATIENT_CLINIC_OR_DEPARTMENT_OTHER): Payer: Self-pay

## 2024-08-31 MED ORDER — GABAPENTIN 300 MG PO CAPS
600.0000 mg | ORAL_CAPSULE | Freq: Three times a day (TID) | ORAL | 1 refills | Status: AC
  Filled 2024-09-01: qty 180, 30d supply, fill #0

## 2024-09-01 ENCOUNTER — Other Ambulatory Visit (HOSPITAL_BASED_OUTPATIENT_CLINIC_OR_DEPARTMENT_OTHER): Payer: Self-pay

## 2024-09-08 ENCOUNTER — Telehealth: Payer: Self-pay

## 2024-09-08 ENCOUNTER — Encounter: Payer: Self-pay | Admitting: Family Medicine

## 2024-09-08 NOTE — Telephone Encounter (Signed)
 Yes, virtual is fine!  Thanks, Laymon JINNY Legions, MD

## 2024-09-08 NOTE — Telephone Encounter (Signed)
 Patient LVM on nurse line requesting to schedule virtual visit with Dr. Donah.   She reports that she recently had reconstructive surgery on her elbow and was advised to follow up with PCP.   Due to inability to currently drive, she is asking if this appointment can be scheduled virtually.   Will forward request to Dr. Donah.   Chiquita JAYSON English, RN

## 2024-09-15 ENCOUNTER — Telehealth: Admitting: Family Medicine
